# Patient Record
Sex: Male | Born: 1965 | Race: Black or African American | Hispanic: No | Marital: Single | Smoking: Current every day smoker
Health system: Southern US, Community
[De-identification: ages and names within clinical notes are randomized; demographics above are authoritative.]

## PROBLEM LIST (undated history)

## (undated) DIAGNOSIS — F1290 Cannabis use, unspecified, uncomplicated: Secondary | ICD-10-CM

## (undated) DIAGNOSIS — N200 Calculus of kidney: Secondary | ICD-10-CM

## (undated) DIAGNOSIS — F1910 Other psychoactive substance abuse, uncomplicated: Secondary | ICD-10-CM

## (undated) DIAGNOSIS — M4802 Spinal stenosis, cervical region: Secondary | ICD-10-CM

## (undated) DIAGNOSIS — F1410 Cocaine abuse, uncomplicated: Secondary | ICD-10-CM

## (undated) DIAGNOSIS — Z720 Tobacco use: Secondary | ICD-10-CM

## (undated) DIAGNOSIS — E663 Overweight: Secondary | ICD-10-CM

## (undated) HISTORY — PX: HERNIA REPAIR: SHX51

## (undated) HISTORY — PX: FRACTURE SURGERY: SHX138

---

## 2012-01-31 ENCOUNTER — Emergency Department: Payer: Self-pay

## 2013-03-02 ENCOUNTER — Emergency Department: Payer: Self-pay | Admitting: Emergency Medicine

## 2013-10-06 ENCOUNTER — Emergency Department: Payer: Self-pay | Admitting: Emergency Medicine

## 2013-10-06 LAB — URINALYSIS, COMPLETE
Bacteria: NONE SEEN
Bilirubin,UR: NEGATIVE
Blood: NEGATIVE
Glucose,UR: NEGATIVE mg/dL (ref 0–75)
KETONE: NEGATIVE
LEUKOCYTE ESTERASE: NEGATIVE
Nitrite: NEGATIVE
PH: 5 (ref 4.5–8.0)
Protein: NEGATIVE
RBC,UR: 2 /HPF (ref 0–5)
Specific Gravity: 1.027 (ref 1.003–1.030)
Squamous Epithelial: NONE SEEN
WBC UR: 1 /HPF (ref 0–5)

## 2014-06-14 ENCOUNTER — Emergency Department: Payer: Self-pay

## 2014-06-14 DIAGNOSIS — Y9389 Activity, other specified: Secondary | ICD-10-CM | POA: Insufficient documentation

## 2014-06-14 DIAGNOSIS — Y998 Other external cause status: Secondary | ICD-10-CM | POA: Insufficient documentation

## 2014-06-14 DIAGNOSIS — Y92410 Unspecified street and highway as the place of occurrence of the external cause: Secondary | ICD-10-CM | POA: Insufficient documentation

## 2014-06-14 DIAGNOSIS — S82832A Other fracture of upper and lower end of left fibula, initial encounter for closed fracture: Secondary | ICD-10-CM | POA: Insufficient documentation
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
## 2014-06-15 ENCOUNTER — Emergency Department
Admission: EM | Admit: 2014-06-15 | Discharge: 2014-06-15 | Disposition: A | Discharge status: Home / Self Care | Payer: Self-pay | Attending: Emergency Medicine | Admitting: Emergency Medicine

## 2014-06-15 DIAGNOSIS — S82892A Other fracture of left lower leg, initial encounter for closed fracture: Secondary | ICD-10-CM

## 2014-06-15 HISTORY — DX: Calculus of kidney: N20.0

## 2014-06-15 MED ORDER — OXYCODONE-ACETAMINOPHEN 5-325 MG PO TABS: 1.0000 | ORAL_TABLET | Freq: Four times a day (QID) | ORAL | Status: AC | PRN

## 2014-06-15 MED ORDER — OXYCODONE-ACETAMINOPHEN 5-325 MG PO TABS
ORAL_TABLET | ORAL | Status: AC
  Filled 2014-06-15: qty 1

## 2014-06-15 MED ORDER — OXYCODONE-ACETAMINOPHEN 5-325 MG PO TABS
1.0000 | ORAL_TABLET | Freq: Once | ORAL | Status: AC
  Administered 2014-06-15: 1 via ORAL
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
## 2014-06-15 NOTE — Discharge Instructions (Signed)
Ankle Fracture °A fracture is a break in a bone. The ankle joint is made up of three bones. These include the lower (distal) sections of your lower leg bones, called the tibia and fibula, along with a bone in your foot, called the talus. Depending on how bad the break is and if more than one ankle joint bone is broken, a cast or splint is used to protect and keep your injured bone from moving while it heals. Sometimes, surgery is required to help the fracture heal properly.  °There are two general types of fractures: °· Stable fracture. This includes a single fracture line through one bone, with no injury to ankle ligaments. A fracture of the talus that does not have any displacement (movement of the bone on either side of the fracture line) is also stable. °· Unstable fracture. This includes more than one fracture line through one or more bones in the ankle joint. It also includes fractures that have displacement of the bone on either side of the fracture line. °CAUSES °· A direct blow to the ankle.   °· Quickly and severely twisting your ankle. °· Trauma, such as a car accident or falling from a significant height. °RISK FACTORS °You may be at a higher risk of ankle fracture if: °· You have certain medical conditions. °· You are involved in high-impact sports. °· You are involved in a high-impact car accident. °SIGNS AND SYMPTOMS  °· Tender and swollen ankle. °· Bruising around the injured ankle. °· Pain on movement of the ankle. °· Difficulty walking or putting weight on the ankle. °· A cold foot below the site of the ankle injury. This can occur if the blood vessels passing through your injured ankle were also damaged. °· Numbness in the foot below the site of the ankle injury. °DIAGNOSIS  °An ankle fracture is usually diagnosed with a physical exam and X-rays. A CT scan may also be required for complex fractures. °TREATMENT  °Stable fractures are treated with a cast or splint and using crutches to avoid putting  weight on your injured ankle. This is followed by an ankle strengthening program. Some patients require a special type of cast, depending on other medical problems they may have. Unstable fractures require surgery to ensure the bones heal properly. Your health care provider will tell you what type of fracture you have and the best treatment for your condition. °HOME CARE INSTRUCTIONS  °· Review correct crutch use with your health care provider and use your crutches as directed. Safe use of crutches is extremely important. Misuse of crutches can cause you to fall or cause injury to nerves in your hands or armpits. °· Do not put weight or pressure on the injured ankle until directed by your health care provider. °· To lessen the swelling, keep the injured leg elevated while sitting or lying down. °· Apply ice to the injured area: °¨ Put ice in a plastic bag. °¨ Place a towel between your cast and the bag. °¨ Leave the ice on for 20 minutes, 2-3 times a day. °· If you have a plaster or fiberglass cast: °¨ Do not try to scratch the skin under the cast with any objects. This can increase your risk of skin infection. °¨ Check the skin around the cast every day. You may put lotion on any red or sore areas. °¨ Keep your cast dry and clean. °· If you have a plaster splint: °¨ Wear the splint as directed. °¨ You may loosen the elastic   around the splint if your toes become numb, tingle, or turn cold or blue.  Do not put pressure on any part of your cast or splint; it may break. Rest your cast only on a pillow the first 24 hours until it is fully hardened.  Your cast or splint can be protected during bathing with a plastic bag sealed to your skin with medical tape. Do not lower the cast or splint into water.  Take medicines as directed by your health care provider. Only take over-the-counter or prescription medicines for pain, discomfort, or fever as directed by your health care provider.  Do not drive a vehicle until  your health care provider specifically tells you it is safe to do so.  If your health care provider has given you a follow-up appointment, it is very important to keep that appointment. Not keeping the appointment could result in a chronic or permanent injury, pain, and disability. If you have any problem keeping the appointment, call the facility for assistance. SEEK MEDICAL CARE IF: You develop increased swelling or discomfort. SEEK IMMEDIATE MEDICAL CARE IF:   Your cast gets damaged or breaks.  You have continued severe pain.  You develop new pain or swelling after the cast was put on.  Your skin or toenails below the injury turn blue or gray.  Your skin or toenails below the injury feel cold, numb, or have loss of sensitivity to touch.  There is a bad smell or pus draining from under the cast. MAKE SURE YOU:   Understand these instructions.  Will watch your condition.  Will get help right away if you are not doing well or get worse. Document Released: 01/07/2000 Document Revised: 01/14/2013 Document Reviewed: 08/08/2012 [facility] Patient Information 2015 [employer] [place]. This information is not intended to replace advice given to you by your health care provider. Make sure you discuss any questions you have with your health care provider.  Wear the splint keep off the foot he can keep the foot up as much as possible and use ice 20 minutes every hour or 2 if possible do not fall asleep with the ice on the foot because it can burn you. Use crutches. Keep off the foot. For pain you can use Percocet 1-2 pills every 6 hours as needed. Be sure to [place]. Have them call in the morning for appointment in 2-3 days for casting. Be sure to return for increasing pain or numbness in the foot. There are times a day ankle fracture can swell more and the swelling can push against the splint and cut off the blood flow to the foot which can result in serious injury if  not treated promptly. Symptoms of this are increasing pain and foot numbness.

## 2015-10-20 ENCOUNTER — Encounter: Payer: Self-pay | Admitting: Emergency Medicine

## 2015-10-20 ENCOUNTER — Emergency Department
Admission: EM | Admit: 2015-10-20 | Discharge: 2015-10-20 | Disposition: A | Discharge status: Home / Self Care | Payer: No Typology Code available for payment source | Attending: Emergency Medicine | Admitting: Emergency Medicine

## 2015-10-20 DIAGNOSIS — Y999 Unspecified external cause status: Secondary | ICD-10-CM | POA: Diagnosis not present

## 2015-10-20 DIAGNOSIS — S199XXA Unspecified injury of neck, initial encounter: Secondary | ICD-10-CM | POA: Diagnosis present

## 2015-10-20 DIAGNOSIS — Y939 Activity, unspecified: Secondary | ICD-10-CM | POA: Insufficient documentation

## 2015-10-20 DIAGNOSIS — M7918 Myalgia, other site: Secondary | ICD-10-CM

## 2015-10-20 DIAGNOSIS — S161XXA Strain of muscle, fascia and tendon at neck level, initial encounter: Secondary | ICD-10-CM | POA: Diagnosis not present

## 2015-10-20 DIAGNOSIS — Y92410 Unspecified street and highway as the place of occurrence of the external cause: Secondary | ICD-10-CM | POA: Insufficient documentation

## 2015-10-20 DIAGNOSIS — F17200 Nicotine dependence, unspecified, uncomplicated: Secondary | ICD-10-CM | POA: Insufficient documentation

## 2015-10-20 MED ORDER — NABUMETONE 750 MG PO TABS: 750.0000 mg | ORAL_TABLET | Freq: Two times a day (BID) | ORAL | 0 refills | Status: DC

## 2015-10-20 MED ORDER — CYCLOBENZAPRINE HCL 5 MG PO TABS: 5.0000 mg | ORAL_TABLET | Freq: Three times a day (TID) | ORAL | 0 refills | Status: DC | PRN
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
## 2015-10-29 ENCOUNTER — Emergency Department: Payer: No Typology Code available for payment source

## 2015-10-29 ENCOUNTER — Emergency Department
Admission: EM | Admit: 2015-10-29 | Discharge: 2015-10-29 | Disposition: A | Discharge status: Home / Self Care | Payer: No Typology Code available for payment source | Attending: Student in an Organized Health Care Education/Training Program | Admitting: Student in an Organized Health Care Education/Training Program

## 2015-10-29 DIAGNOSIS — S161XXA Strain of muscle, fascia and tendon at neck level, initial encounter: Secondary | ICD-10-CM | POA: Diagnosis not present

## 2015-10-29 DIAGNOSIS — Y999 Unspecified external cause status: Secondary | ICD-10-CM | POA: Insufficient documentation

## 2015-10-29 DIAGNOSIS — S169XXA Unspecified injury of muscle, fascia and tendon at neck level, initial encounter: Secondary | ICD-10-CM | POA: Diagnosis present

## 2015-10-29 DIAGNOSIS — F17200 Nicotine dependence, unspecified, uncomplicated: Secondary | ICD-10-CM | POA: Diagnosis not present

## 2015-10-29 DIAGNOSIS — Y939 Activity, unspecified: Secondary | ICD-10-CM | POA: Insufficient documentation

## 2015-10-29 DIAGNOSIS — Y92410 Unspecified street and highway as the place of occurrence of the external cause: Secondary | ICD-10-CM | POA: Insufficient documentation

## 2015-10-29 MED ORDER — METHOCARBAMOL 500 MG PO TABS
1000.0000 mg | ORAL_TABLET | Freq: Four times a day (QID) | ORAL | 0 refills | Status: DC
Start: 2015-10-29 — End: 2023-08-16

## 2015-10-29 MED ORDER — ETODOLAC 400 MG PO TABS: 400.0000 mg | ORAL_TABLET | Freq: Two times a day (BID) | ORAL | 0 refills | Status: DC

## 2015-10-29 MED ORDER — KETOROLAC TROMETHAMINE 30 MG/ML IJ SOLN
30.0000 mg | Freq: Once | INTRAMUSCULAR | Status: AC
  Administered 2015-10-29: 30 mg via INTRAVENOUS
  Filled 2015-10-29: qty 1

## 2015-10-29 NOTE — Discharge Instructions (Signed)
[facility] if any continued problems. Discontinue taking medication that you have at home. Begin taking etodolac 400 mg twice a day. Robaxin as directed. The aware that these medications could cause drowsiness and increase your chances of falling. You also should not be driving while taking this medication
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
## 2020-07-15 ENCOUNTER — Other Ambulatory Visit (HOSPITAL_COMMUNITY): Payer: Self-pay

## 2023-08-12 ENCOUNTER — Emergency Department: Payer: Self-pay

## 2023-08-12 ENCOUNTER — Inpatient Hospital Stay
Admission: EM | Admit: 2023-08-12 | Discharge: 2023-08-16 | DRG: 473 | Disposition: A | Discharge status: Home / Self Care | Payer: Self-pay | Attending: Internal Medicine | Admitting: Internal Medicine

## 2023-08-12 ENCOUNTER — Encounter: Payer: Self-pay | Admitting: Emergency Medicine

## 2023-08-12 ENCOUNTER — Other Ambulatory Visit: Payer: Self-pay

## 2023-08-12 DIAGNOSIS — M5410 Radiculopathy, site unspecified: Secondary | ICD-10-CM | POA: Diagnosis present

## 2023-08-12 DIAGNOSIS — F1210 Cannabis abuse, uncomplicated: Secondary | ICD-10-CM | POA: Diagnosis present

## 2023-08-12 DIAGNOSIS — M4803 Spinal stenosis, cervicothoracic region: Secondary | ICD-10-CM | POA: Diagnosis present

## 2023-08-12 DIAGNOSIS — R29898 Other symptoms and signs involving the musculoskeletal system: Principal | ICD-10-CM

## 2023-08-12 DIAGNOSIS — M50122 Cervical disc disorder at C5-C6 level with radiculopathy: Secondary | ICD-10-CM | POA: Diagnosis not present

## 2023-08-12 DIAGNOSIS — M5432 Sciatica, left side: Secondary | ICD-10-CM | POA: Diagnosis present

## 2023-08-12 DIAGNOSIS — R739 Hyperglycemia, unspecified: Secondary | ICD-10-CM | POA: Diagnosis not present

## 2023-08-12 DIAGNOSIS — M4802 Spinal stenosis, cervical region: Secondary | ICD-10-CM | POA: Diagnosis present

## 2023-08-12 DIAGNOSIS — F17200 Nicotine dependence, unspecified, uncomplicated: Secondary | ICD-10-CM | POA: Diagnosis present

## 2023-08-12 DIAGNOSIS — D72829 Elevated white blood cell count, unspecified: Secondary | ICD-10-CM | POA: Diagnosis not present

## 2023-08-12 DIAGNOSIS — F1410 Cocaine abuse, uncomplicated: Secondary | ICD-10-CM | POA: Diagnosis present

## 2023-08-12 DIAGNOSIS — M5431 Sciatica, right side: Secondary | ICD-10-CM | POA: Diagnosis present

## 2023-08-12 LAB — CBC
HCT: 44 % (ref 39.0–52.0)
Hemoglobin: 14.8 g/dL (ref 13.0–17.0)
MCH: 29.4 pg (ref 26.0–34.0)
MCHC: 33.6 g/dL (ref 30.0–36.0)
MCV: 87.5 fL (ref 80.0–100.0)
Platelets: 197 K/uL (ref 150–400)
RBC: 5.03 MIL/uL (ref 4.22–5.81)
RDW: 14.9 % (ref 11.5–15.5)
WBC: 5.4 K/uL (ref 4.0–10.5)
nRBC: 0 % (ref 0.0–0.2)

## 2023-08-12 LAB — PROTIME-INR
INR: 1 (ref 0.8–1.2)
Prothrombin Time: 13.8 s (ref 11.4–15.2)

## 2023-08-12 LAB — COMPREHENSIVE METABOLIC PANEL WITH GFR
ALT: 47 U/L — ABNORMAL HIGH (ref 0–44)
AST: 90 U/L — ABNORMAL HIGH (ref 15–41)
Albumin: 3.6 g/dL (ref 3.5–5.0)
Alkaline Phosphatase: 117 U/L (ref 38–126)
Anion gap: 9 (ref 5–15)
BUN: 13 mg/dL (ref 6–20)
CO2: 27 mmol/L (ref 22–32)
Calcium: 8.9 mg/dL (ref 8.9–10.3)
Chloride: 104 mmol/L (ref 98–111)
Creatinine, Ser: 0.98 mg/dL (ref 0.61–1.24)
GFR, Estimated: >60 mL/min (ref >60)
Glucose, Bld: 97 mg/dL (ref 70–99)
Potassium: 3.8 mmol/L (ref 3.5–5.1)
Sodium: 140 mmol/L (ref 135–145)
Total Bilirubin: 0.7 mg/dL (ref 0.0–1.2)
Total Protein: 8.1 g/dL (ref 6.5–8.1)

## 2023-08-12 LAB — DIFFERENTIAL
Abs Immature Granulocytes: 0.01 K/uL (ref 0.00–0.07)
Basophils Absolute: 0.1 K/uL (ref 0.0–0.1)
Basophils Relative: 1 %
Eosinophils Absolute: 0.3 K/uL (ref 0.0–0.5)
Eosinophils Relative: 6 %
Immature Granulocytes: 0 %
Lymphocytes Relative: 32 %
Lymphs Abs: 1.7 K/uL (ref 0.7–4.0)
Monocytes Absolute: 0.5 K/uL (ref 0.1–1.0)
Monocytes Relative: 10 %
Neutro Abs: 2.8 K/uL (ref 1.7–7.7)
Neutrophils Relative %: 51 %

## 2023-08-12 LAB — APTT: aPTT: 29 s (ref 24–36)

## 2023-08-12 LAB — ETHANOL: Alcohol, Ethyl (B): <15 mg/dL (ref <15)

## 2023-08-12 MED ORDER — GADOBUTROL 1 MMOL/ML IV SOLN
10.0000 mL | Freq: Once | INTRAVENOUS | Status: AC | PRN
  Administered 2023-08-12: 10 mL via INTRAVENOUS

## 2023-08-12 NOTE — ED Provider Notes (Signed)
 [facility] Provider Note    Event Date/Time   First MD Initiated Contact with Patient 08/12/23 2219     (approximate)   History   No chief complaint on file. Chief complaint is left arm weakness and pain  HPI  Neil Parks is a 58 y.o. male with reportedly no past medical history except for back and neck pain that initiated in June not on any medications who presents to the emergency department with over 9 hours of worsening neck pain and numbness to the left dorsum of his hand and inability to grip.  Patient is visiting the area for family function from [place].  He states he has an outpatient MRI scheduled in [place] in the next week.  He was at a family lunch and bent over and's realized that he could no longer grip in his left hand and he had increased numbness over the dorsum of his left hand over his 5th-2nd digits.  He denies any wrist weakness elbow weakness or shoulder weakness.  He denies any history of IV drug use.  He has no headache hearing or vision changes chest pain shortness of breath abdominal pain urinary or bowel incontinence or lower extremity weakness      Physical Exam   Triage Vital Signs: ED Triage Vitals  Encounter Vitals Group     BP 08/12/23 2112 132/89     Girls Systolic BP Percentile --      Girls Diastolic BP Percentile --      Boys Systolic BP Percentile --      Boys Diastolic BP Percentile --      Pulse Rate 08/12/23 2112 82     Resp 08/12/23 2112 17     Temp 08/12/23 2112 98.7 F (37.1 C)     Temp Source 08/12/23 2112 Oral     SpO2 08/12/23 2112 97 %     Weight 08/12/23 2111 210 lb (95.3 kg)     Height 08/12/23 2111 6' (1.829 m)     Head Circumference --      Peak Flow --      Pain Score --      Pain Loc --      Pain Education --      Exclude from Growth Chart --     Most recent vital signs: Vitals:   08/12/23 2112  BP: 132/89  Pulse: 82  Resp: 17  Temp: 98.7 F (37.1 C)  SpO2: 97%    Nursing  Triage Note reviewed. Vital signs reviewed and patients oxygen saturation is normoxic  General: Patient is well nourished, well developed, awake and alert, resting comfortably in no acute distress, ambulates into the room Head: Normocephalic and atraumatic Eyes: Normal inspection, extraocular muscles intact, no conjunctival pallor Ear, nose, throat: Normal external exam Neck: Normal range of motion, no C-spine tenderness to palpation Respiratory: Patient is in no respiratory distress, lungs CTAB Cardiovascular: Patient is not tachycardic, RRR without murmur appreciated GI: Abd SNT with no guarding or rebound  Back: Normal inspection of the back with good strength and range of motion throughout all ext Extremities: pulses intact with good cap refills, no LE pitting edema or calf tenderness Neuro: The patient is alert and oriented to person, place, and time, appropriately conversive, LUE: Patient has decreased strength with flexion and extension of digits 5 through 2 and decreased sensation over the dorsum of his hand he has 5 out of 5 strength in wrist flexion and extension elbow extension  and flexion and shoulder flexion and extension Skin: Warm, dry, and intact Psych: normal mood and affect, no SI or HI  ED Results / Procedures / Treatments   Labs (all labs ordered are listed, but only abnormal results are displayed) Labs Reviewed  COMPREHENSIVE METABOLIC PANEL WITH GFR - Abnormal; Notable for the following components:      Result Value   AST 90 (*)    ALT 47 (*)    All other components within normal limits  PROTIME-INR  APTT  CBC  DIFFERENTIAL  ETHANOL  URINE DRUG SCREEN, QUALITATIVE ([facility] ONLY)  CBG MONITORING, ED     EKG None  RADIOLOGY CT head: Unremarkable on my independent review and interpretation radiologist agrees MRI of the C-spine demonstrates possible impingement on C6-T1    PROCEDURES:  Critical Care performed: No  Procedures   MEDICATIONS ORDERED IN  ED: Medications  dexamethasone  (DECADRON ) injection 4 mg (has no administration in time range)  gadobutrol  (GADAVIST ) 1 MMOL/ML injection 10 mL (10 mLs Intravenous Contrast Given 08/12/23 2342)     IMPRESSION / MDM / ASSESSMENT AND PLAN / ED COURSE                                Differential diagnosis includes, but is not limited to, radiculopathy of left upper extremity, spinal impingement, epidural abscess, less likely CVA  ED course: Patient arrives and does have a focal deficits in his distal hand.  He is certainly out of the window for any TNK and presentation not consistent with large vessel occlusion.  I do think his history is most consistent with a radiculopathy.  Unfortunately he receives most of his care out of town.  CT head which independently reviewed and interpreted demonstrated no intracranial hemorrhage (ordered in triage) I have ordered an MRI of the brain.  To evaluate for possible subacute CVA but also MRI of the C-spine and T-spine as I do think he is most affected along regions C6-T1.  Anticipate patient will be signed out to oncoming physician pending results of the studies   Clinical Course as of 08/13/23 0036  Sun Aug 12, 2023  2227 CT HEAD WO CONTRAST Unremarkable [HD]  2332 Sodium: 140 [HD]  Mon Aug 13, 2023  0028 1. No acute intracranial abnormality or abnormal enhancement. 2. C5-6 small disc osteophyte complex with mild spinal canal stenosis and severe bilateral foraminal stenosis. 3. C6-7 left asymmetric disc bulge with severe left foraminal stenosis. 4. C7-T1 small disc bulge with severe bilateral foraminal stenosis.  Electronically signed by: Franky Stanford MD 08/13/2023 12:18 AM EDT RP Workstation: [id number]   [HD]  0028 Neurosurgery paged [HD]  0028 Case discussed with neurosurgery consulted Dr. Claudene and he recommends ordering flex extend films keeping the patient n.p.o., doing Decadron  taper starting at 4 mg, admitting to the hospitalist and he will  evaluate in the morning for possible operative intervention [HD]    Clinical Course User Index [HD] Nicholaus Rolland BRAVO, MD    Data(2/3 categories following were performed): I reviewed or ordered at least three unique tests, external notes, and/or the history required an independent historian as one of the three requirements as following: At least 3 labs/imaging studies were obtained and/or reviewed. AND  I discussed the management of the patient with the following external physician or qualified healthcare provider: Admitting physician  Risk: This patient has a high risk of morbidity due to further diagnostic testing or  treatment. Rationale: Decision made regarding admission  Admit Level 5 - Labs/Rads, Admit, Consult: Neurosurgery  Suggested E/M Coding Level: 5, 99285  This level has been selected based on the 2021/09/04 CPT guidelines for E/M codes in the Emergency Department based on 2/3 of the CoPA, Data, and Risk.   FINAL CLINICAL IMPRESSION(S) / ED DIAGNOSES   Final diagnoses:  Left hand weakness  Radiculopathy affecting upper extremity     Rx / DC Orders   ED Discharge Orders     None        Note:  This document was prepared using Dragon voice recognition software and may include unintentional dictation errors.   Nicholaus Rolland BRAVO, MD 08/12/23 [id number]    Nicholaus Rolland BRAVO, MD 08/13/23 [phone]

## 2023-08-12 NOTE — ED Triage Notes (Addendum)
 Arrived pov with complaints of left arm pain and numbness  Per pt I was supposed to have an mri of my back on 7/18 but didn't go. I was moved some clothes (nothing heavy) went and sat on the porch started to pick up my cigar and my arm just stopped working right.  Per pt this all happened around noon today  No drift noted  Does have numbness in left arm  Last known normal was 1200, discussed pt with Dr. Nicholaus orders placed

## 2023-08-13 ENCOUNTER — Inpatient Hospital Stay: Payer: Self-pay

## 2023-08-13 ENCOUNTER — Emergency Department: Payer: Self-pay

## 2023-08-13 DIAGNOSIS — M4802 Spinal stenosis, cervical region: Secondary | ICD-10-CM

## 2023-08-13 DIAGNOSIS — G9520 Unspecified cord compression: Secondary | ICD-10-CM

## 2023-08-13 DIAGNOSIS — M5431 Sciatica, right side: Secondary | ICD-10-CM | POA: Diagnosis present

## 2023-08-13 DIAGNOSIS — D72829 Elevated white blood cell count, unspecified: Secondary | ICD-10-CM | POA: Diagnosis not present

## 2023-08-13 DIAGNOSIS — M50122 Cervical disc disorder at C5-C6 level with radiculopathy: Secondary | ICD-10-CM | POA: Diagnosis present

## 2023-08-13 DIAGNOSIS — M5432 Sciatica, left side: Secondary | ICD-10-CM | POA: Diagnosis present

## 2023-08-13 DIAGNOSIS — F1410 Cocaine abuse, uncomplicated: Secondary | ICD-10-CM | POA: Diagnosis present

## 2023-08-13 DIAGNOSIS — M4803 Spinal stenosis, cervicothoracic region: Secondary | ICD-10-CM | POA: Diagnosis present

## 2023-08-13 DIAGNOSIS — R739 Hyperglycemia, unspecified: Secondary | ICD-10-CM | POA: Diagnosis not present

## 2023-08-13 DIAGNOSIS — F17200 Nicotine dependence, unspecified, uncomplicated: Secondary | ICD-10-CM | POA: Diagnosis present

## 2023-08-13 DIAGNOSIS — M5410 Radiculopathy, site unspecified: Secondary | ICD-10-CM | POA: Diagnosis present

## 2023-08-13 DIAGNOSIS — F1210 Cannabis abuse, uncomplicated: Secondary | ICD-10-CM | POA: Diagnosis present

## 2023-08-13 DIAGNOSIS — M5412 Radiculopathy, cervical region: Secondary | ICD-10-CM

## 2023-08-13 DIAGNOSIS — R29898 Other symptoms and signs involving the musculoskeletal system: Principal | ICD-10-CM

## 2023-08-13 DIAGNOSIS — G629 Polyneuropathy, unspecified: Secondary | ICD-10-CM

## 2023-08-13 LAB — URINE DRUG SCREEN, QUALITATIVE (ARMC ONLY)
Amphetamines, Ur Screen: NOT DETECTED
Barbiturates, Ur Screen: NOT DETECTED
Benzodiazepine, Ur Scrn: NOT DETECTED
Cannabinoid 50 Ng, Ur ~~LOC~~: POSITIVE — AB
Cocaine Metabolite,Ur ~~LOC~~: POSITIVE — AB
MDMA (Ecstasy)Ur Screen: NOT DETECTED
Methadone Scn, Ur: NOT DETECTED
Opiate, Ur Screen: NOT DETECTED
Phencyclidine (PCP) Ur S: NOT DETECTED
Tricyclic, Ur Screen: NOT DETECTED

## 2023-08-13 LAB — CK: Total CK: 154 U/L (ref 49–397)

## 2023-08-13 LAB — SEDIMENTATION RATE: Sed Rate: 13 mm/h (ref 0–20)

## 2023-08-13 LAB — SURGICAL PCR SCREEN
MRSA, PCR: NEGATIVE
Staphylococcus aureus: NEGATIVE

## 2023-08-13 LAB — C-REACTIVE PROTEIN: CRP: <0.5 mg/dL (ref <1.0)

## 2023-08-13 LAB — HIV ANTIBODY (ROUTINE TESTING W REFLEX): HIV Screen 4th Generation wRfx: NONREACTIVE

## 2023-08-13 MED ORDER — DEXAMETHASONE SODIUM PHOSPHATE 4 MG/ML IJ SOLN
2.0000 mg | Freq: Once | INTRAMUSCULAR | Status: AC
  Administered 2023-08-13: 2 mg via INTRAVENOUS
  Filled 2023-08-13: qty 0.5

## 2023-08-13 MED ORDER — DEXAMETHASONE SODIUM PHOSPHATE 4 MG/ML IJ SOLN
3.0000 mg | Freq: Once | INTRAMUSCULAR | Status: AC
  Administered 2023-08-13: 3 mg via INTRAVENOUS
  Filled 2023-08-13: qty 0.75

## 2023-08-13 MED ORDER — METOPROLOL TARTRATE 5 MG/5ML IV SOLN: 5.0000 mg | Freq: Four times a day (QID) | INTRAVENOUS | Status: DC | PRN

## 2023-08-13 MED ORDER — FENTANYL CITRATE PF 50 MCG/ML IJ SOSY
12.5000 ug | PREFILLED_SYRINGE | INTRAMUSCULAR | Status: DC | PRN
  Administered 2023-08-13 (×2): 50 ug via INTRAVENOUS
  Filled 2023-08-13 (×2): qty 1

## 2023-08-13 MED ORDER — DEXAMETHASONE SODIUM PHOSPHATE 4 MG/ML IJ SOLN
1.0000 mg | Freq: Once | INTRAMUSCULAR | Status: AC
  Administered 2023-08-13: 1 mg via INTRAVENOUS
  Filled 2023-08-13: qty 0.25

## 2023-08-13 MED ORDER — DEXAMETHASONE SODIUM PHOSPHATE 4 MG/ML IJ SOLN
4.0000 mg | Freq: Once | INTRAMUSCULAR | Status: AC
  Administered 2023-08-13: 4 mg via INTRAVENOUS
  Filled 2023-08-13: qty 1

## 2023-08-13 NOTE — H&P (View-Only) (Signed)
 Consulting Department:  Inpatient medicine  Primary Physician:  Patient, No Pcp Per  Chief Complaint: Left arm weakness  History of Present Illness: 08/13/2023 JOANNE BRANDER is a 58 y.o. male who presents with the chief complaint of acute onset of left arm weakness.  He has a history of chronic neck and back pain.  He was recently put on restricted/light duty for worsening symptoms.  He was here for a family reunion yesterday when he noted that while reaching down to pick up a cigar he was unable to lift his left wrist.  He did not have any severe pain but did have significant weakness.  He has not had any progressive signs or symptoms consistent with myelopathy.  Most of his symptoms are unilateral on the left side.  He is also has some tingling and sensation changes.  The symptoms are causing a significant impact on the patient's life.   Review of Systems:  A 10 point review of systems is negative, except for the pertinent positives and negatives detailed in the HPI.  Past Medical History: Past Medical History:  Diagnosis Date   Kidney stones     Past Surgical History: Past Surgical History:  Procedure Laterality Date   FRACTURE SURGERY     HERNIA REPAIR      Allergies: Allergies as of 08/12/2023   (No Known Allergies)    Medications:  Current Facility-Administered Medications:    [START ON 08/14/2023] dexamethasone  (DECADRON ) injection 1 mg, 1 mg, Intravenous, Once, Fredirick Glenys RAMAN, MD   dexamethasone  (DECADRON ) injection 2 mg, 2 mg, Intravenous, Once, Fredirick Glenys RAMAN, MD   fentaNYL  (SUBLIMAZE ) injection 12.5-50 mcg, 12.5-50 mcg, Intravenous, Q2H PRN, Pratt, Tanya S, MD   metoprolol  tartrate (LOPRESSOR ) injection 5 mg, 5 mg, Intravenous, Q6H PRN, Fredirick Glenys RAMAN, MD   Social History: Social History   Tobacco Use   Smoking status: Every Day   Smokeless tobacco: Never  Substance Use Topics   Alcohol use: Yes    Family Medical History: History reviewed. No  pertinent family history.  Physical Examination: Vitals:   08/13/23 0140 08/13/23 0724  BP: 130/79 130/81  Pulse: 68 (!) 52  Resp: 18 16  Temp: 97.9 F (36.6 C) 97.8 F (36.6 C)  SpO2: 99% 96%     General: Patient is well developed, well nourished, calm, collected, and in no apparent distress.  NEUROLOGICAL:  General: In no acute distress.   Awake, alert, oriented to person, place, and time.  Pupils equal round and reactive to light.  Facial tone is symmetric.  Tongue protrusion is midline.  There is no pronator drift.  Strength: Side Biceps Triceps Deltoid Interossei Grip Wrist Ext. Wrist Flex.  R 5 5 5 5 5 5 5   L 4+ 4 4+ 1-2 1-2 1 3     Reflexes are absent in the left biceps and brachioradialis, he has some preservation of his left pectoralis reflex.  Tricep is decreased.  Right sided reflexes are 1-2+.  Clonus is not present.     Imaging: CT CERVICAL SPINE WO CONTRAST Result Date: 08/13/2023 CLINICAL DATA:  Cervical radiculopathy. Continued neck and back pain. Loss of control of left arm and hand. EXAM: CT CERVICAL SPINE WITHOUT CONTRAST TECHNIQUE: Multidetector CT imaging of the cervical spine was performed without intravenous contrast. Multiplanar CT image reconstructions were also generated. RADIATION DOSE REDUCTION: This exam was performed according to the departmental dose-optimization program which includes automated exposure control, adjustment of the mA and/or kV according to patient size  and/or use of iterative reconstruction technique. COMPARISON:  MR cervical spine 08/12/2023. CT cervical spine 10/29/2015. FINDINGS: Alignment: Straightening without focal angulation or listhesis. Skull base and vertebrae: No evidence of acute cervical spine fracture or traumatic subluxation. Chronic endplate degenerative changes have mildly progressed compared with the remote CT from 2017, most advanced from C4-5 through C7-T1. Chronic ossification of the ligamentum nuchae. Soft tissues  and spinal canal: No prevertebral fluid or swelling. No visible canal hematoma. Disc levels: As correlated with recent MRI, and no significant spinal stenosis, foraminal narrowing or nerve root impingement at C2-3 or C3-4. C4-5: Moderate loss of disc height with disc bulging, endplate osteophytes and bilateral uncinate spurring. Mild foraminal narrowing bilaterally. C5-6: Moderate loss of disc height with posterior osteophytes covering diffusely bulging disc material. Resulting mild to moderate multifactorial spinal stenosis and moderate to severe foraminal narrowing bilaterally, as seen on MRI. C6-7: Moderate loss of disc height with posterior osteophytes covering diffusely bulging disc material. Asymmetric uncinate spurring on the left with moderate to severe left foraminal narrowing and possible left C7 nerve root impingement. C7-T1: Advanced loss of disc height with posterior osteophytes covering diffusely bulging disc material. Asymmetric disc osteophyte complex within the left foramen with resulting severe left foraminal narrowing and probable left C8 nerve root impingement. Moderate to severe right foraminal narrowing. Upper chest: Clear lung apices. Other: None. IMPRESSION: 1. No evidence of acute cervical spine fracture, traumatic subluxation or static signs of instability. 2. Multilevel cervical spondylosis as described, mildly progressed compared with remote CT from 2017. 3. Individual disc space levels are detailed on previous day MRI. Mild to moderate multifactorial spinal stenosis at C5-6. Multilevel foraminal narrowing as described, greatest C5-6, C6-7 and C7-T1. Electronically Signed   By: Elsie Perone M.D.   On: 08/13/2023 11:34   DG Cervical Spine With Flex & Extend Result Date: 08/13/2023 EXAM: Flexion and Extension VIEW(S) XRAY OF THE CERVICAL SPINE 08/13/2023 01:27:00 AM COMPARISON: None available. CLINICAL HISTORY: Evaluate for radiculopathy. Pt states that he was injured at work on  07/18/23 and has been having neck and left arm pain since. FINDINGS: BONES: No acute fracture. No aggressive appearing osseous lesion. Alignment is normal. No abnormal motion with flexion/extension. DISCS AND DEGENERATIVE CHANGES: Mild degenerative changes in the mid/lower cervical spine. Mild narrowing of the right C5-6 neural foramen. Moderate narrowing of the right C7-T1 neural foramen. Evaluation of the left neural foramina is constrained by obliquity, but mild narrowing is suspected at C5-6. SOFT TISSUES: No prevertebral soft tissue swelling. The visualized lungs appear clear. IMPRESSION: 1. No abnormal motion with flexion/extension. 2. Mild degenerative changes in the mid/lower cervical spine. 3. Bilateral neural foraminal narrowing, as above. Electronically signed by: Pinkie Pebbles MD 08/13/2023 01:38 AM EDT RP Workstation: [id number]   MR Cervical Spine W and Wo Contrast Result Date: 08/13/2023 EXAM: MRI BRAIN WITHOUT CONTRAST MRI CERVICAL SPINE WITHOUT AND WITH CONTRAST MRI THORACIC SPINE WITHOUT AND WITH CONTRAST 08/12/2023 11:54:41 PM TECHNIQUE: Multiplanar multisequence MRI of the brain was performed without the administration of intravenous contrast. Multiplanar multisequence MRI of the cervical and thoracic spine was performed without and with the administration of intravenous contrast. CONTRAST: 10mL gadavist  COMPARISON: None available. CLINICAL HISTORY: Unable to flex left hand, most consistent with radiculopathy, but risk factors for CVA. FINDINGS: MRI BRAIN: BRAIN AND VENTRICLES: No acute infarct. No acute intracranial hemorrhage. No mass or abnormal enhancement. No midline shift. No hydrocephalus. The sella is unremarkable. Normal flow voids. ORBITS: No acute abnormality. SINUSES AND MASTOIDS: Mild  ethmoid sinus mucosal thickening. BONES AND SOFT TISSUES: Normal bone marrow signal. No acute soft tissue abnormality. MRI CERVICAL AND THORACIC SPINE: BONES AND ALIGNMENT: Mild degenerative height  loss at the C4-6 levels. No abnormal enhancement. SPINAL CORD: No focal lesion of the spinal cord. SOFT TISSUES: Unremarkable. C2-C3: No significant disc herniation. No spinal canal stenosis or neural foraminal narrowing. C3-C4: Small central disc protrusion without stenosis. C4-C5: No significant disc herniation. No spinal canal stenosis or neural foraminal narrowing. C5-C6: Small disc osteophyte complex with mild spinal canal stenosis and severe bilateral foraminal stenosis. C6-C7: Left asymmetric disc bulge with severe left foraminal stenosis. C7-T1: Small disc bulge with severe bilateral foraminal stenosis. No disk herniation, spinal canal stenosis or neural foraminal stenosis of the thoracic spine. IMPRESSION: 1. No acute intracranial abnormality or abnormal enhancement. 2. C5-6 small disc osteophyte complex with mild spinal canal stenosis and severe bilateral foraminal stenosis. 3. C6-7 left asymmetric disc bulge with severe left foraminal stenosis. 4. C7-T1 small disc bulge with severe bilateral foraminal stenosis. Electronically signed by: Franky Stanford MD 08/13/2023 12:18 AM EDT RP Workstation: [id number]   MR THORACIC SPINE W WO CONTRAST Result Date: 08/13/2023 EXAM: MRI BRAIN WITHOUT CONTRAST MRI CERVICAL SPINE WITHOUT AND WITH CONTRAST MRI THORACIC SPINE WITHOUT AND WITH CONTRAST 08/12/2023 11:54:41 PM TECHNIQUE: Multiplanar multisequence MRI of the brain was performed without the administration of intravenous contrast. Multiplanar multisequence MRI of the cervical and thoracic spine was performed without and with the administration of intravenous contrast. CONTRAST: 10mL gadavist  COMPARISON: None available. CLINICAL HISTORY: Unable to flex left hand, most consistent with radiculopathy, but risk factors for CVA. FINDINGS: MRI BRAIN: BRAIN AND VENTRICLES: No acute infarct. No acute intracranial hemorrhage. No mass or abnormal enhancement. No midline shift. No hydrocephalus. The sella is unremarkable.  Normal flow voids. ORBITS: No acute abnormality. SINUSES AND MASTOIDS: Mild ethmoid sinus mucosal thickening. BONES AND SOFT TISSUES: Normal bone marrow signal. No acute soft tissue abnormality. MRI CERVICAL AND THORACIC SPINE: BONES AND ALIGNMENT: Mild degenerative height loss at the C4-6 levels. No abnormal enhancement. SPINAL CORD: No focal lesion of the spinal cord. SOFT TISSUES: Unremarkable. C2-C3: No significant disc herniation. No spinal canal stenosis or neural foraminal narrowing. C3-C4: Small central disc protrusion without stenosis. C4-C5: No significant disc herniation. No spinal canal stenosis or neural foraminal narrowing. C5-C6: Small disc osteophyte complex with mild spinal canal stenosis and severe bilateral foraminal stenosis. C6-C7: Left asymmetric disc bulge with severe left foraminal stenosis. C7-T1: Small disc bulge with severe bilateral foraminal stenosis. No disk herniation, spinal canal stenosis or neural foraminal stenosis of the thoracic spine. IMPRESSION: 1. No acute intracranial abnormality or abnormal enhancement. 2. C5-6 small disc osteophyte complex with mild spinal canal stenosis and severe bilateral foraminal stenosis. 3. C6-7 left asymmetric disc bulge with severe left foraminal stenosis. 4. C7-T1 small disc bulge with severe bilateral foraminal stenosis. Electronically signed by: Franky Stanford MD 08/13/2023 12:18 AM EDT RP Workstation: [id number]   MR BRAIN WO CONTRAST Result Date: 08/13/2023 EXAM: MRI BRAIN WITHOUT CONTRAST MRI CERVICAL SPINE WITHOUT AND WITH CONTRAST MRI THORACIC SPINE WITHOUT AND WITH CONTRAST 08/12/2023 11:54:41 PM TECHNIQUE: Multiplanar multisequence MRI of the brain was performed without the administration of intravenous contrast. Multiplanar multisequence MRI of the cervical and thoracic spine was performed without and with the administration of intravenous contrast. CONTRAST: 10mL gadavist  COMPARISON: None available. CLINICAL HISTORY: Unable to flex  left hand, most consistent with radiculopathy, but risk factors for CVA. FINDINGS: MRI BRAIN: BRAIN AND VENTRICLES: No  acute infarct. No acute intracranial hemorrhage. No mass or abnormal enhancement. No midline shift. No hydrocephalus. The sella is unremarkable. Normal flow voids. ORBITS: No acute abnormality. SINUSES AND MASTOIDS: Mild ethmoid sinus mucosal thickening. BONES AND SOFT TISSUES: Normal bone marrow signal. No acute soft tissue abnormality. MRI CERVICAL AND THORACIC SPINE: BONES AND ALIGNMENT: Mild degenerative height loss at the C4-6 levels. No abnormal enhancement. SPINAL CORD: No focal lesion of the spinal cord. SOFT TISSUES: Unremarkable. C2-C3: No significant disc herniation. No spinal canal stenosis or neural foraminal narrowing. C3-C4: Small central disc protrusion without stenosis. C4-C5: No significant disc herniation. No spinal canal stenosis or neural foraminal narrowing. C5-C6: Small disc osteophyte complex with mild spinal canal stenosis and severe bilateral foraminal stenosis. C6-C7: Left asymmetric disc bulge with severe left foraminal stenosis. C7-T1: Small disc bulge with severe bilateral foraminal stenosis. No disk herniation, spinal canal stenosis or neural foraminal stenosis of the thoracic spine. IMPRESSION: 1. No acute intracranial abnormality or abnormal enhancement. 2. C5-6 small disc osteophyte complex with mild spinal canal stenosis and severe bilateral foraminal stenosis. 3. C6-7 left asymmetric disc bulge with severe left foraminal stenosis. 4. C7-T1 small disc bulge with severe bilateral foraminal stenosis. Electronically signed by: Franky Stanford MD 08/13/2023 12:18 AM EDT RP Workstation: [id number]   CT HEAD WO CONTRAST Result Date: 08/12/2023 EXAM: CT HEAD WITHOUT CONTRAST 08/12/2023 09:58:42 PM TECHNIQUE: CT of the head was performed without the administration of intravenous contrast. Automated exposure control, iterative reconstruction, and/or weight based adjustment  of the mA/kV was utilized to reduce the radiation dose to as low as reasonably achievable. COMPARISON: None available. CLINICAL HISTORY: Numbness or tingling, paresthesia (Ped 0-17y). Pt complaints of left arm pain and numbness; Per pt I was supposed to have an mri of my back on 7/18 but didn't go. I was moving some clothes (nothing heavy) went and sat on the porch started to pick up my cigar and my arm just stopped working right.; Per pt this all happened around noon today. FINDINGS: BRAIN AND VENTRICLES: No acute hemorrhage. Gray-white differentiation is preserved. No hydrocephalus. No extra-axial collection. No mass effect or midline shift. ORBITS: No acute abnormality. SINUSES: Mild paranasal sinus mucosal thickening. SOFT TISSUES AND SKULL: No acute soft tissue abnormality. No skull fracture. IMPRESSION: 1. No acute intracranial abnormality. Electronically signed by: Franky Stanford MD 08/12/2023 10:02 PM EDT RP Workstation: [id number]     I have personally reviewed the images and agree with the above interpretation.  Labs:    Latest Ref Rng & Units 08/12/2023   10:12 PM  CBC  WBC 4.0 - 10.5 K/uL 5.4   Hemoglobin 13.0 - 17.0 g/dL 85.1   Hematocrit 60.9 - 52.0 % 44.0   Platelets 150 - 400 K/uL 197       Latest Ref Rng & Units 08/12/2023   10:12 PM  BMP  Glucose 70 - 99 mg/dL 97   BUN 6 - 20 mg/dL 13   Creatinine 9.38 - 1.24 mg/dL 9.01   Sodium 864 - 854 mmol/L 140   Potassium 3.5 - 5.1 mmol/L 3.8   Chloride 98 - 111 mmol/L 104   CO2 22 - 32 mmol/L 27   Calcium 8.9 - 10.3 mg/dL 8.9     INR  1.0 (92/79 2212)   Assessment and Plan: Mr. Seth is a pleasant 58 y.o. male with history of cervical stenosis lumbar stenosis chronic neck and back pain.  He was here in [place]  for family reunion.  He noticed  yesterday that when he was attempting to pick up his a car he was unable to lift his hand.  He mostly uses his right side so did not know when this started exactly but as this  was persistent he came into the emergency department for evaluation.  Had extensive workup for concerns for stroke inflammatory disease, however part of his workup included cross-sectional imaging of his spine which demonstrated severe foraminal stenosis bilaterally most prominent from C5-T1.  He showed a significant dense C6-C8 type presentation with some more mild C5 symptoms.  He does show some slight weakness on the right unclear whether or not this is give way.  Given the density of his deficits I asked that he be seen by neurology for evaluation for possible peripheral etiology as he did have some fasciculations noted however these were mostly in his extensor compartment.  I spoke directly with my neurology colleague who felt that the highest likelihood was a cervical radiculopathy multiple levels.  It is unclear whether or not the patient had any trauma or any inciting event that pushed him over the edge from his severe cervical stenosis.  He was positive for cocaine so potential ischemic event at the hyper stenotic foramina could be possible.  We discussed and felt that his areas of spinal compression were most severe at the C5-T1 levels and plan to move forward with a C5-T1 anterior cervical discectomy and fusion in the morning.  I discussed the risk and benefits of the procedure with the patient.  I did discuss that since his deficit was so severe I was unsure his likelihood for recovery.  I also discussed but not limited to swallowing problems, breathing problems, vascular injuries, given his recent cocaine use higher risk for perioperative complications.   Penne MICAEL Sharps, MD/MSCR Dept. of Neurosurgery

## 2023-08-13 NOTE — Consult Note (Signed)
 NEUROLOGY CONSULT NOTE   Date of service: August 13, 2023 Patient Name: Neil Parks MRN:  [id number] DOB:  1965-01-28 Chief Complaint: Left hand weakness Requesting Provider: Awanda City, MD  History of Present Illness  Neil Parks is a 58 y.o. right-handed male with hx of degenerative disc disease  He reports that about 2 weeks ago he was put on light duty at work due to sciatic pain down both of his legs; his work involves frequently lifting boxes.  He has been having some neck pain since that time as well (July 19, 2023).  However he did not notice any weakness of his left arm until yesterday although he reports he mostly uses the right arm at work as he is right-handed and only uses the left for a little bit of support if the boxes are heavier.   Yesterday he bent down to pick up a cigar and could not move his left arm properly, particularly the hand.  Subsequently when he laid back he realized he was having worsening of his neck pain.  He reports his symptoms have been stable since onset.  He reports his brother thought his left hand might have been a bit swollen at that time but he attributes this to a watchband that he thought might be too tight.  He does not feel that he has had any muscle loss in that hand, but has noted some twitching of the fingers since this acute event yesterday   ROS  Comprehensive ROS performed and pertinent positives documented in HPI   Past History   Past Medical History:  Diagnosis Date   Kidney stones     Past Surgical History:  Procedure Laterality Date   FRACTURE SURGERY     HERNIA REPAIR      Family History: History reviewed. No pertinent family history.  Social History  reports that he has been smoking. He has never used smokeless tobacco. He reports current alcohol use. No history on file for drug use.  No Known Allergies  Medications   Current Facility-Administered Medications:    [START ON 08/14/2023] dexamethasone  (DECADRON )  injection 1 mg, 1 mg, Intravenous, Once, Fredirick, Glenys RAMAN, MD   dexamethasone  (DECADRON ) injection 2 mg, 2 mg, Intravenous, Once, Fredirick, Glenys RAMAN, MD   fentaNYL  (SUBLIMAZE ) injection 12.5-50 mcg, 12.5-50 mcg, Intravenous, Q2H PRN, Pratt, Tanya S, MD   metoprolol  tartrate (LOPRESSOR ) injection 5 mg, 5 mg, Intravenous, Q6H PRN, Fredirick Glenys RAMAN, MD  Vitals   Vitals:   08/13/23 0038 08/13/23 0126 08/13/23 0140 08/13/23 0724  BP: (!) 147/97  130/79 130/81  Pulse: 79  68 (!) 52  Resp: 20  18 16   Temp:  98.2 F (36.8 C) 97.9 F (36.6 C) 97.8 F (36.6 C)  TempSrc:  Oral Oral   SpO2: 100%  99% 96%  Weight:      Height:   6' (1.829 m)     Body mass index is 28.48 kg/m.   Physical Exam    Constitutional: Appears well-developed and well-nourished.  Psych: Affect appropriate to situation, calm and cooperative  Eyes: No scleral injection HENT: No oropharyngeal obstruction. Some pain with neck rotation and flexion MSK: no major joint deformities.  Cardiovascular: Normal rate and regular rhythm. Perfusing extremities well Respiratory: Effort normal, non-labored breathing GI: Soft.  No distension. There is no tenderness.  Skin: Warm dry and intact visible skin  Neurologic Examination   Mental Status: Patient is awake, alert, oriented to person, place, month, year, and  situation. Patient is able to give a clear and coherent history. No signs of aphasia or neglect Cranial Nerves: II: Visual Fields are full. Pupils are equal, round, and reactive to light.   III,IV, VI: EOMI without ptosis or diploplia.  V: Facial sensation is symmetric to temperature VII: Facial movement is symmetric.  VIII: hearing is intact to voice X: Uvula elevates symmetrically XI: Shoulder shrug is symmetric. XII: tongue is midline without atrophy or fasciculations.  Motor: Slight fasciculations noted in the left hand, possibly 1 in the left biceps.  Question subtle difference in the hypothenar emience vs. normal  variation given more use of the RUE at work  Right Left  Arm abduction  4+  4-  Elbow flexion  5  4  Elbow extension  5  4-  Wrist flexion  5  4-  Wrist extension  5  3  Finger extension   2  Finger flexion      Grip  5  4  Hip flexion    Knee extension    Knee flexion    Foot dorsiflexion    Foot plantarflexion    Foot inversion    Foot eversion    Sensory: Reduced laterally more than medially in the right upper extremity  Deep Tendon Reflexes: Biceps 1+ right, 0 left  Brachioradialis 2+ but slightly diminished right, 0 left  Patellar 2+ and brisk bilaterally  Cerebellar: FNF and HKS are intact bilaterally within limits of weakness on the left  Gait:  Able to rise on heels and toes, normal casual gait other than slightly wide based. Difficulty with tandem gait due to low back pain   Labs/Imaging/Neurodiagnostic studies   CBC:  Recent Labs  Lab 2023/08/13 2212  WBC 5.4  NEUTROABS 2.8  HGB 14.8  HCT 44.0  MCV 87.5  PLT 197   Basic Metabolic Panel:  Lab Results  Component Value Date   NA 140 Aug 13, 2023   K 3.8 08/13/23   CO2 27 2023/08/13   GLUCOSE 97 08/13/2023   BUN 13 08/13/2023   CREATININE 0.98 08/13/23   CALCIUM 8.9 08/13/2023   GFRNONAA >60 13-Aug-2023   Lipid Panel: No results found for: LDLCALC HgbA1c: No results found for: HGBA1C Urine Drug Screen:     Component Value Date/Time   LABOPIA NONE DETECTED 08/13/2023 0720   COCAINSCRNUR POSITIVE (A) 08/13/2023 0720   LABBENZ NONE DETECTED 08/13/2023 0720   AMPHETMU NONE DETECTED 08/13/2023 0720   THCU POSITIVE (A) 08/13/2023 0720   LABBARB NONE DETECTED 08/13/2023 0720    Alcohol Level     Component Value Date/Time   [facility] <15 13-Aug-2023 2212   INR  Lab Results  Component Value Date   INR 1.0 08-13-23   APTT  Lab Results  Component Value Date   APTT 29 08-13-2023   MRI Brain, C-spine, T-spine, personally reviewed, agree with radiology: 1. No acute intracranial abnormality or  abnormal enhancement. 2. C5-6 small disc osteophyte complex with mild spinal canal stenosis and severe bilateral foraminal stenosis. 3. C6-7 left asymmetric disc bulge with severe left foraminal stenosis. 4. C7-T1 small disc bulge with severe bilateral foraminal stenosis.   ASSESSMENT   Neil Parks is a 58 y.o. male presenting with the chief complaint of left hand weakness but on examination also has some mild right deltoid weakness and diminished right biceps reflex in addition to more significant findings on the left upper extremity as detailed above.  These findings best localized to acute cervical spine compressive  pathology especially with history of acute onset while bending down to pick up something with noted worsening of his neck pain after onset.  Suspect there is some cord effect not well seen on MRI as well as the bilateral neuroforaminal stenoses described throughout the lower cervical spine.  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 Claudene via secure chat and phone and Dr. Awanda via secure chat ______________________________________________________________________  Lola Jernigan MD-PhD [facility] [phone] Available 7 AM to 7 PM, outside these hours please contact Neurologist on call listed on AMION

## 2023-08-13 NOTE — H&P (Signed)
 History and Physical    Patient: Neil Parks FMW:[id number] DOB: [birth year] DOA: 08/12/2023 DOS: the patient was seen and examined on 08/13/2023 PCP: Patient, No Pcp Per  Patient coming from: Home  Chief Complaint: No chief complaint on file.  HPI: Neil Parks is a 58 y.o. male with medical history significant of nothing who presents with continued neck and back pain.  He works for [employer] and had a scheduled MRI this past week though he has been out of town.  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  Today at a barbecue he lost control of his left arm and hand and was unable to use it.  He came into the ED and was noted to have significant stenosis at C6-C7 and C7-T1 and neurosurgery was called and recommended the patient remain n.p.o. for possible surgery in the a.m. plus steroids. Review of Systems: As mentioned in the history of present illness. All other systems reviewed and are negative. Past Medical History:  Diagnosis Date   Kidney stones    Past Surgical History:  Procedure Laterality Date   FRACTURE SURGERY     HERNIA REPAIR     Social History:  reports that he has been smoking. He has never used smokeless tobacco. He reports current alcohol use. No history on file for drug use.  No Known Allergies  History reviewed. No pertinent family history.  Prior to Admission medications   Medication Sig Start Date End Date Taking? Authorizing Provider  cyclobenzaprine  (FLEXERIL ) 5 MG tablet Take 1 tablet (5 mg total) by mouth 3 (three) times daily as needed for muscle spasms. 10/20/15   Menshew, Candida LULLA Kings, PA-C  etodolac  (LODINE ) 400 MG tablet Take 1 tablet (400 mg total) by mouth 2 (two) times daily. 10/29/15   Saunders Shona CROME, PA-C  methocarbamol  (ROBAXIN ) 500 MG tablet Take 2 tablets (1,000 mg total) by mouth 4 (four) times daily. 10/29/15   Saunders Shona CROME, PA-C  nabumetone  (RELAFEN ) 750 MG tablet Take 1 tablet (750 mg total) by mouth 2 (two) times daily.  10/20/15   Menshew, Candida LULLA Kings, PA-C    Physical Exam: Vitals:   08/12/23 2111 08/12/23 2112 08/13/23 0038  BP:  132/89 (!) 147/97  Pulse:  82 79  Resp:  17 20  Temp:  98.7 F (37.1 C)   TempSrc:  Oral   SpO2:  97% 100%  Weight: 95.3 kg    Height: 6' (1.829 m)     Physical Examination: General appearance - alert, well appearing, and in no distress Neck - supple, no significant adenopathy Chest -normal effort Abdomen - soft, nontender, nondistended, no masses or organomegaly Neurological - alert, oriented, normal speech, decreased grip strength in the left hand decreased sensation in the left hand and inability to extend the fingers on the hand   Data Reviewed: Results for orders placed or performed during the hospital encounter of 08/12/23 (from the past 24 hours)  Protime-INR     Status: None   Collection Time: 08/12/23 10:12 PM  Result Value Ref Range   Prothrombin Time 13.8 11.4 - 15.2 seconds   INR 1.0 0.8 - 1.2  APTT     Status: None   Collection Time: 08/12/23 10:12 PM  Result Value Ref Range   aPTT 29 24 - 36 seconds  CBC     Status: None   Collection Time: 08/12/23 10:12 PM  Result Value Ref Range   WBC 5.4 4.0 - 10.5 K/uL  RBC 5.03 4.22 - 5.81 MIL/uL   Hemoglobin 14.8 13.0 - 17.0 g/dL   HCT 55.9 60.9 - 47.9 %   MCV 87.5 80.0 - 100.0 fL   MCH 29.4 26.0 - 34.0 pg   MCHC 33.6 30.0 - 36.0 g/dL   RDW 85.0 88.4 - 84.4 %   Platelets 197 150 - 400 K/uL   nRBC 0.0 0.0 - 0.2 %  Differential     Status: None   Collection Time: 08/12/23 10:12 PM  Result Value Ref Range   Neutrophils Relative % 51 %   Neutro Abs 2.8 1.7 - 7.7 K/uL   Lymphocytes Relative 32 %   Lymphs Abs 1.7 0.7 - 4.0 K/uL   Monocytes Relative 10 %   Monocytes Absolute 0.5 0.1 - 1.0 K/uL   Eosinophils Relative 6 %   Eosinophils Absolute 0.3 0.0 - 0.5 K/uL   Basophils Relative 1 %   Basophils Absolute 0.1 0.0 - 0.1 K/uL   Immature Granulocytes 0 %   Abs Immature Granulocytes 0.01 0.00 -  0.07 K/uL  Comprehensive metabolic panel     Status: Abnormal   Collection Time: 08/12/23 10:12 PM  Result Value Ref Range   Sodium 140 135 - 145 mmol/L   Potassium 3.8 3.5 - 5.1 mmol/L   Chloride 104 98 - 111 mmol/L   CO2 27 22 - 32 mmol/L   Glucose, Bld 97 70 - 99 mg/dL   BUN 13 6 - 20 mg/dL   Creatinine, Ser 9.01 0.61 - 1.24 mg/dL   Calcium 8.9 8.9 - 89.6 mg/dL   Total Protein 8.1 6.5 - 8.1 g/dL   Albumin 3.6 3.5 - 5.0 g/dL   AST 90 (H) 15 - 41 U/L   ALT 47 (H) 0 - 44 U/L   Alkaline Phosphatase 117 38 - 126 U/L   Total Bilirubin 0.7 0.0 - 1.2 mg/dL   GFR, Estimated >39 >39 mL/min   Anion gap 9 5 - 15  Ethanol     Status: None   Collection Time: 08/12/23 10:12 PM  Result Value Ref Range   Alcohol, Ethyl (B) <15 <15 mg/dL   MR Cervical Spine W and Wo Contrast Result Date: 08/13/2023 EXAM: MRI BRAIN WITHOUT CONTRAST MRI CERVICAL SPINE WITHOUT AND WITH CONTRAST MRI THORACIC SPINE WITHOUT AND WITH CONTRAST 08/12/2023 11:54:41 PM TECHNIQUE: Multiplanar multisequence MRI of the brain was performed without the administration of intravenous contrast. Multiplanar multisequence MRI of the cervical and thoracic spine was performed without and with the administration of intravenous contrast. CONTRAST: 10mL gadavist  COMPARISON: None available. CLINICAL HISTORY: Unable to flex left hand, most consistent with radiculopathy, but risk factors for CVA. FINDINGS: MRI BRAIN: BRAIN AND VENTRICLES: No acute infarct. No acute intracranial hemorrhage. No mass or abnormal enhancement. No midline shift. No hydrocephalus. The sella is unremarkable. Normal flow voids. ORBITS: No acute abnormality. SINUSES AND MASTOIDS: Mild ethmoid sinus mucosal thickening. BONES AND SOFT TISSUES: Normal bone marrow signal. No acute soft tissue abnormality. MRI CERVICAL AND THORACIC SPINE: BONES AND ALIGNMENT: Mild degenerative height loss at the C4-6 levels. No abnormal enhancement. SPINAL CORD: No focal lesion of the spinal cord.  SOFT TISSUES: Unremarkable. C2-C3: No significant disc herniation. No spinal canal stenosis or neural foraminal narrowing. C3-C4: Small central disc protrusion without stenosis. C4-C5: No significant disc herniation. No spinal canal stenosis or neural foraminal narrowing. C5-C6: Small disc osteophyte complex with mild spinal canal stenosis and severe bilateral foraminal stenosis. C6-C7: Left asymmetric disc bulge with severe left  foraminal stenosis. C7-T1: Small disc bulge with severe bilateral foraminal stenosis. No disk herniation, spinal canal stenosis or neural foraminal stenosis of the thoracic spine. IMPRESSION: 1. No acute intracranial abnormality or abnormal enhancement. 2. C5-6 small disc osteophyte complex with mild spinal canal stenosis and severe bilateral foraminal stenosis. 3. C6-7 left asymmetric disc bulge with severe left foraminal stenosis. 4. C7-T1 small disc bulge with severe bilateral foraminal stenosis. Electronically signed by: Franky Stanford MD 08/13/2023 12:18 AM EDT RP Workstation: [id number]   MR THORACIC SPINE W WO CONTRAST Result Date: 08/13/2023 EXAM: MRI BRAIN WITHOUT CONTRAST MRI CERVICAL SPINE WITHOUT AND WITH CONTRAST MRI THORACIC SPINE WITHOUT AND WITH CONTRAST 08/12/2023 11:54:41 PM TECHNIQUE: Multiplanar multisequence MRI of the brain was performed without the administration of intravenous contrast. Multiplanar multisequence MRI of the cervical and thoracic spine was performed without and with the administration of intravenous contrast. CONTRAST: 10mL gadavist  COMPARISON: None available. CLINICAL HISTORY: Unable to flex left hand, most consistent with radiculopathy, but risk factors for CVA. FINDINGS: MRI BRAIN: BRAIN AND VENTRICLES: No acute infarct. No acute intracranial hemorrhage. No mass or abnormal enhancement. No midline shift. No hydrocephalus. The sella is unremarkable. Normal flow voids. ORBITS: No acute abnormality. SINUSES AND MASTOIDS: Mild ethmoid sinus mucosal  thickening. BONES AND SOFT TISSUES: Normal bone marrow signal. No acute soft tissue abnormality. MRI CERVICAL AND THORACIC SPINE: BONES AND ALIGNMENT: Mild degenerative height loss at the C4-6 levels. No abnormal enhancement. SPINAL CORD: No focal lesion of the spinal cord. SOFT TISSUES: Unremarkable. C2-C3: No significant disc herniation. No spinal canal stenosis or neural foraminal narrowing. C3-C4: Small central disc protrusion without stenosis. C4-C5: No significant disc herniation. No spinal canal stenosis or neural foraminal narrowing. C5-C6: Small disc osteophyte complex with mild spinal canal stenosis and severe bilateral foraminal stenosis. C6-C7: Left asymmetric disc bulge with severe left foraminal stenosis. C7-T1: Small disc bulge with severe bilateral foraminal stenosis. No disk herniation, spinal canal stenosis or neural foraminal stenosis of the thoracic spine. IMPRESSION: 1. No acute intracranial abnormality or abnormal enhancement. 2. C5-6 small disc osteophyte complex with mild spinal canal stenosis and severe bilateral foraminal stenosis. 3. C6-7 left asymmetric disc bulge with severe left foraminal stenosis. 4. C7-T1 small disc bulge with severe bilateral foraminal stenosis. Electronically signed by: Franky Stanford MD 08/13/2023 12:18 AM EDT RP Workstation: [id number]   MR BRAIN WO CONTRAST Result Date: 08/13/2023 EXAM: MRI BRAIN WITHOUT CONTRAST MRI CERVICAL SPINE WITHOUT AND WITH CONTRAST MRI THORACIC SPINE WITHOUT AND WITH CONTRAST 08/12/2023 11:54:41 PM TECHNIQUE: Multiplanar multisequence MRI of the brain was performed without the administration of intravenous contrast. Multiplanar multisequence MRI of the cervical and thoracic spine was performed without and with the administration of intravenous contrast. CONTRAST: 10mL gadavist  COMPARISON: None available. CLINICAL HISTORY: Unable to flex left hand, most consistent with radiculopathy, but risk factors for CVA. FINDINGS: MRI BRAIN: BRAIN  AND VENTRICLES: No acute infarct. No acute intracranial hemorrhage. No mass or abnormal enhancement. No midline shift. No hydrocephalus. The sella is unremarkable. Normal flow voids. ORBITS: No acute abnormality. SINUSES AND MASTOIDS: Mild ethmoid sinus mucosal thickening. BONES AND SOFT TISSUES: Normal bone marrow signal. No acute soft tissue abnormality. MRI CERVICAL AND THORACIC SPINE: BONES AND ALIGNMENT: Mild degenerative height loss at the C4-6 levels. No abnormal enhancement. SPINAL CORD: No focal lesion of the spinal cord. SOFT TISSUES: Unremarkable. C2-C3: No significant disc herniation. No spinal canal stenosis or neural foraminal narrowing. C3-C4: Small central disc protrusion without stenosis. C4-C5: No significant disc herniation. No  spinal canal stenosis or neural foraminal narrowing. C5-C6: Small disc osteophyte complex with mild spinal canal stenosis and severe bilateral foraminal stenosis. C6-C7: Left asymmetric disc bulge with severe left foraminal stenosis. C7-T1: Small disc bulge with severe bilateral foraminal stenosis. No disk herniation, spinal canal stenosis or neural foraminal stenosis of the thoracic spine. IMPRESSION: 1. No acute intracranial abnormality or abnormal enhancement. 2. C5-6 small disc osteophyte complex with mild spinal canal stenosis and severe bilateral foraminal stenosis. 3. C6-7 left asymmetric disc bulge with severe left foraminal stenosis. 4. C7-T1 small disc bulge with severe bilateral foraminal stenosis. Electronically signed by: Franky Stanford MD 08/13/2023 12:18 AM EDT RP Workstation: [id number]   CT HEAD WO CONTRAST Result Date: 08/12/2023 EXAM: CT HEAD WITHOUT CONTRAST 08/12/2023 09:58:42 PM TECHNIQUE: CT of the head was performed without the administration of intravenous contrast. Automated exposure control, iterative reconstruction, and/or weight based adjustment of the mA/kV was utilized to reduce the radiation dose to as low as reasonably achievable.  COMPARISON: None available. CLINICAL HISTORY: Numbness or tingling, paresthesia (Ped 0-17y). Pt complaints of left arm pain and numbness; Per pt I was supposed to have an mri of my back on 7/18 but didn't go. I was moving some clothes (nothing heavy) went and sat on the porch started to pick up my cigar and my arm just stopped working right.; Per pt this all happened around noon today. FINDINGS: BRAIN AND VENTRICLES: No acute hemorrhage. Gray-white differentiation is preserved. No hydrocephalus. No extra-axial collection. No mass effect or midline shift. ORBITS: No acute abnormality. SINUSES: Mild paranasal sinus mucosal thickening. SOFT TISSUES AND SKULL: No acute soft tissue abnormality. No skull fracture. IMPRESSION: 1. No acute intracranial abnormality. Electronically signed by: Franky Stanford MD 08/12/2023 10:02 PM EDT RP Workstation: [id number]     Assessment and Plan: * Radiculopathy affecting upper extremity Per neurosurgery, Decadron  taper 4 mg IV (8 hours 8 3 mg IV then 8 hours 2 mg IV then in 8 hours 1 mg IV N.p.o. after midnight for possible surgery in the morning      Advance Care Planning:   Code Status: Not on file Full  Consults: Neurosurgery  Family Communication: Patient at bedside  Severity of Illness: The appropriate patient status for this patient is INPATIENT. Inpatient status is judged to be reasonable and necessary in order to provide the required intensity of service to ensure the patient's safety. The patient's presenting symptoms, physical exam findings, and initial radiographic and laboratory data in the context of their chronic comorbidities is felt to place them at high risk for further clinical deterioration. Furthermore, it is not anticipated that the patient will be medically stable for discharge from the hospital within 2 midnights of admission.   * I certify that at the point of admission it is my clinical judgment that the patient will require inpatient  hospital care spanning beyond 2 midnights from the point of admission due to high intensity of service, high risk for further deterioration and high frequency of surveillance required.*  Author: Glenys GORMAN Birk, MD 08/13/2023 12:48 AM  For on call review [email or web address].

## 2023-08-13 NOTE — Consult Note (Signed)
 Consulting Department:  Inpatient medicine  Primary Physician:  Patient, No Pcp Per  Chief Complaint: Left arm weakness  History of Present Illness: 08/13/2023 Neil Parks is a 58 y.o. male who presents with the chief complaint of acute onset of left arm weakness.  He has a history of chronic neck and back pain.  He was recently put on restricted/light duty for worsening symptoms.  He was here for a family reunion yesterday when he noted that while reaching down to pick up a cigar he was unable to lift his left wrist.  He did not have any severe pain but did have significant weakness.  He has not had any progressive signs or symptoms consistent with myelopathy.  Most of his symptoms are unilateral on the left side.  He is also has some tingling and sensation changes.  The symptoms are causing a significant impact on the patient's life.   Review of Systems:  A 10 point review of systems is negative, except for the pertinent positives and negatives detailed in the HPI.  Past Medical History: Past Medical History:  Diagnosis Date   Kidney stones     Past Surgical History: Past Surgical History:  Procedure Laterality Date   FRACTURE SURGERY     HERNIA REPAIR      Allergies: Allergies as of 08/12/2023   (No Known Allergies)    Medications:  Current Facility-Administered Medications:    [START ON 08/14/2023] dexamethasone  (DECADRON ) injection 1 mg, 1 mg, Intravenous, Once, Fredirick Glenys RAMAN, MD   dexamethasone  (DECADRON ) injection 2 mg, 2 mg, Intravenous, Once, Fredirick Glenys RAMAN, MD   fentaNYL  (SUBLIMAZE ) injection 12.5-50 mcg, 12.5-50 mcg, Intravenous, Q2H PRN, Pratt, Tanya S, MD   metoprolol  tartrate (LOPRESSOR ) injection 5 mg, 5 mg, Intravenous, Q6H PRN, Fredirick Glenys RAMAN, MD   Social History: Social History   Tobacco Use   Smoking status: Every Day   Smokeless tobacco: Never  Substance Use Topics   Alcohol use: Yes    Family Medical History: History reviewed. No  pertinent family history.  Physical Examination: Vitals:   08/13/23 0140 08/13/23 0724  BP: 130/79 130/81  Pulse: 68 (!) 52  Resp: 18 16  Temp: 97.9 F (36.6 C) 97.8 F (36.6 C)  SpO2: 99% 96%     General: Patient is well developed, well nourished, calm, collected, and in no apparent distress.  NEUROLOGICAL:  General: In no acute distress.   Awake, alert, oriented to person, place, and time.  Pupils equal round and reactive to light.  Facial tone is symmetric.  Tongue protrusion is midline.  There is no pronator drift.  Strength: Side Biceps Triceps Deltoid Interossei Grip Wrist Ext. Wrist Flex.  R 5 5 5 5 5 5 5   L 4+ 4 4+ 1-2 1-2 1 3     Reflexes are absent in the left biceps and brachioradialis, he has some preservation of his left pectoralis reflex.  Tricep is decreased.  Right sided reflexes are 1-2+.  Clonus is not present.     Imaging: CT CERVICAL SPINE WO CONTRAST Result Date: 08/13/2023 CLINICAL DATA:  Cervical radiculopathy. Continued neck and back pain. Loss of control of left arm and hand. EXAM: CT CERVICAL SPINE WITHOUT CONTRAST TECHNIQUE: Multidetector CT imaging of the cervical spine was performed without intravenous contrast. Multiplanar CT image reconstructions were also generated. RADIATION DOSE REDUCTION: This exam was performed according to the departmental dose-optimization program which includes automated exposure control, adjustment of the mA and/or kV according to patient size  and/or use of iterative reconstruction technique. COMPARISON:  MR cervical spine 08/12/2023. CT cervical spine 10/29/2015. FINDINGS: Alignment: Straightening without focal angulation or listhesis. Skull base and vertebrae: No evidence of acute cervical spine fracture or traumatic subluxation. Chronic endplate degenerative changes have mildly progressed compared with the remote CT from 2017, most advanced from C4-5 through C7-T1. Chronic ossification of the ligamentum nuchae. Soft tissues  and spinal canal: No prevertebral fluid or swelling. No visible canal hematoma. Disc levels: As correlated with recent MRI, and no significant spinal stenosis, foraminal narrowing or nerve root impingement at C2-3 or C3-4. C4-5: Moderate loss of disc height with disc bulging, endplate osteophytes and bilateral uncinate spurring. Mild foraminal narrowing bilaterally. C5-6: Moderate loss of disc height with posterior osteophytes covering diffusely bulging disc material. Resulting mild to moderate multifactorial spinal stenosis and moderate to severe foraminal narrowing bilaterally, as seen on MRI. C6-7: Moderate loss of disc height with posterior osteophytes covering diffusely bulging disc material. Asymmetric uncinate spurring on the left with moderate to severe left foraminal narrowing and possible left C7 nerve root impingement. C7-T1: Advanced loss of disc height with posterior osteophytes covering diffusely bulging disc material. Asymmetric disc osteophyte complex within the left foramen with resulting severe left foraminal narrowing and probable left C8 nerve root impingement. Moderate to severe right foraminal narrowing. Upper chest: Clear lung apices. Other: None. IMPRESSION: 1. No evidence of acute cervical spine fracture, traumatic subluxation or static signs of instability. 2. Multilevel cervical spondylosis as described, mildly progressed compared with remote CT from 2017. 3. Individual disc space levels are detailed on previous day MRI. Mild to moderate multifactorial spinal stenosis at C5-6. Multilevel foraminal narrowing as described, greatest C5-6, C6-7 and C7-T1. Electronically Signed   By: Elsie Perone M.D.   On: 08/13/2023 11:34   DG Cervical Spine With Flex & Extend Result Date: 08/13/2023 EXAM: Flexion and Extension VIEW(S) XRAY OF THE CERVICAL SPINE 08/13/2023 01:27:00 AM COMPARISON: None available. CLINICAL HISTORY: Evaluate for radiculopathy. Pt states that he was injured at work on  07/18/23 and has been having neck and left arm pain since. FINDINGS: BONES: No acute fracture. No aggressive appearing osseous lesion. Alignment is normal. No abnormal motion with flexion/extension. DISCS AND DEGENERATIVE CHANGES: Mild degenerative changes in the mid/lower cervical spine. Mild narrowing of the right C5-6 neural foramen. Moderate narrowing of the right C7-T1 neural foramen. Evaluation of the left neural foramina is constrained by obliquity, but mild narrowing is suspected at C5-6. SOFT TISSUES: No prevertebral soft tissue swelling. The visualized lungs appear clear. IMPRESSION: 1. No abnormal motion with flexion/extension. 2. Mild degenerative changes in the mid/lower cervical spine. 3. Bilateral neural foraminal narrowing, as above. Electronically signed by: Pinkie Pebbles MD 08/13/2023 01:38 AM EDT RP Workstation: [id number]   MR Cervical Spine W and Wo Contrast Result Date: 08/13/2023 EXAM: MRI BRAIN WITHOUT CONTRAST MRI CERVICAL SPINE WITHOUT AND WITH CONTRAST MRI THORACIC SPINE WITHOUT AND WITH CONTRAST 08/12/2023 11:54:41 PM TECHNIQUE: Multiplanar multisequence MRI of the brain was performed without the administration of intravenous contrast. Multiplanar multisequence MRI of the cervical and thoracic spine was performed without and with the administration of intravenous contrast. CONTRAST: 10mL gadavist  COMPARISON: None available. CLINICAL HISTORY: Unable to flex left hand, most consistent with radiculopathy, but risk factors for CVA. FINDINGS: MRI BRAIN: BRAIN AND VENTRICLES: No acute infarct. No acute intracranial hemorrhage. No mass or abnormal enhancement. No midline shift. No hydrocephalus. The sella is unremarkable. Normal flow voids. ORBITS: No acute abnormality. SINUSES AND MASTOIDS: Mild  ethmoid sinus mucosal thickening. BONES AND SOFT TISSUES: Normal bone marrow signal. No acute soft tissue abnormality. MRI CERVICAL AND THORACIC SPINE: BONES AND ALIGNMENT: Mild degenerative height  loss at the C4-6 levels. No abnormal enhancement. SPINAL CORD: No focal lesion of the spinal cord. SOFT TISSUES: Unremarkable. C2-C3: No significant disc herniation. No spinal canal stenosis or neural foraminal narrowing. C3-C4: Small central disc protrusion without stenosis. C4-C5: No significant disc herniation. No spinal canal stenosis or neural foraminal narrowing. C5-C6: Small disc osteophyte complex with mild spinal canal stenosis and severe bilateral foraminal stenosis. C6-C7: Left asymmetric disc bulge with severe left foraminal stenosis. C7-T1: Small disc bulge with severe bilateral foraminal stenosis. No disk herniation, spinal canal stenosis or neural foraminal stenosis of the thoracic spine. IMPRESSION: 1. No acute intracranial abnormality or abnormal enhancement. 2. C5-6 small disc osteophyte complex with mild spinal canal stenosis and severe bilateral foraminal stenosis. 3. C6-7 left asymmetric disc bulge with severe left foraminal stenosis. 4. C7-T1 small disc bulge with severe bilateral foraminal stenosis. Electronically signed by: Franky Stanford MD 08/13/2023 12:18 AM EDT RP Workstation: [id number]   MR THORACIC SPINE W WO CONTRAST Result Date: 08/13/2023 EXAM: MRI BRAIN WITHOUT CONTRAST MRI CERVICAL SPINE WITHOUT AND WITH CONTRAST MRI THORACIC SPINE WITHOUT AND WITH CONTRAST 08/12/2023 11:54:41 PM TECHNIQUE: Multiplanar multisequence MRI of the brain was performed without the administration of intravenous contrast. Multiplanar multisequence MRI of the cervical and thoracic spine was performed without and with the administration of intravenous contrast. CONTRAST: 10mL gadavist  COMPARISON: None available. CLINICAL HISTORY: Unable to flex left hand, most consistent with radiculopathy, but risk factors for CVA. FINDINGS: MRI BRAIN: BRAIN AND VENTRICLES: No acute infarct. No acute intracranial hemorrhage. No mass or abnormal enhancement. No midline shift. No hydrocephalus. The sella is unremarkable.  Normal flow voids. ORBITS: No acute abnormality. SINUSES AND MASTOIDS: Mild ethmoid sinus mucosal thickening. BONES AND SOFT TISSUES: Normal bone marrow signal. No acute soft tissue abnormality. MRI CERVICAL AND THORACIC SPINE: BONES AND ALIGNMENT: Mild degenerative height loss at the C4-6 levels. No abnormal enhancement. SPINAL CORD: No focal lesion of the spinal cord. SOFT TISSUES: Unremarkable. C2-C3: No significant disc herniation. No spinal canal stenosis or neural foraminal narrowing. C3-C4: Small central disc protrusion without stenosis. C4-C5: No significant disc herniation. No spinal canal stenosis or neural foraminal narrowing. C5-C6: Small disc osteophyte complex with mild spinal canal stenosis and severe bilateral foraminal stenosis. C6-C7: Left asymmetric disc bulge with severe left foraminal stenosis. C7-T1: Small disc bulge with severe bilateral foraminal stenosis. No disk herniation, spinal canal stenosis or neural foraminal stenosis of the thoracic spine. IMPRESSION: 1. No acute intracranial abnormality or abnormal enhancement. 2. C5-6 small disc osteophyte complex with mild spinal canal stenosis and severe bilateral foraminal stenosis. 3. C6-7 left asymmetric disc bulge with severe left foraminal stenosis. 4. C7-T1 small disc bulge with severe bilateral foraminal stenosis. Electronically signed by: Franky Stanford MD 08/13/2023 12:18 AM EDT RP Workstation: [id number]   MR BRAIN WO CONTRAST Result Date: 08/13/2023 EXAM: MRI BRAIN WITHOUT CONTRAST MRI CERVICAL SPINE WITHOUT AND WITH CONTRAST MRI THORACIC SPINE WITHOUT AND WITH CONTRAST 08/12/2023 11:54:41 PM TECHNIQUE: Multiplanar multisequence MRI of the brain was performed without the administration of intravenous contrast. Multiplanar multisequence MRI of the cervical and thoracic spine was performed without and with the administration of intravenous contrast. CONTRAST: 10mL gadavist  COMPARISON: None available. CLINICAL HISTORY: Unable to flex  left hand, most consistent with radiculopathy, but risk factors for CVA. FINDINGS: MRI BRAIN: BRAIN AND VENTRICLES: No  acute infarct. No acute intracranial hemorrhage. No mass or abnormal enhancement. No midline shift. No hydrocephalus. The sella is unremarkable. Normal flow voids. ORBITS: No acute abnormality. SINUSES AND MASTOIDS: Mild ethmoid sinus mucosal thickening. BONES AND SOFT TISSUES: Normal bone marrow signal. No acute soft tissue abnormality. MRI CERVICAL AND THORACIC SPINE: BONES AND ALIGNMENT: Mild degenerative height loss at the C4-6 levels. No abnormal enhancement. SPINAL CORD: No focal lesion of the spinal cord. SOFT TISSUES: Unremarkable. C2-C3: No significant disc herniation. No spinal canal stenosis or neural foraminal narrowing. C3-C4: Small central disc protrusion without stenosis. C4-C5: No significant disc herniation. No spinal canal stenosis or neural foraminal narrowing. C5-C6: Small disc osteophyte complex with mild spinal canal stenosis and severe bilateral foraminal stenosis. C6-C7: Left asymmetric disc bulge with severe left foraminal stenosis. C7-T1: Small disc bulge with severe bilateral foraminal stenosis. No disk herniation, spinal canal stenosis or neural foraminal stenosis of the thoracic spine. IMPRESSION: 1. No acute intracranial abnormality or abnormal enhancement. 2. C5-6 small disc osteophyte complex with mild spinal canal stenosis and severe bilateral foraminal stenosis. 3. C6-7 left asymmetric disc bulge with severe left foraminal stenosis. 4. C7-T1 small disc bulge with severe bilateral foraminal stenosis. Electronically signed by: Franky Stanford MD 08/13/2023 12:18 AM EDT RP Workstation: [id number]   CT HEAD WO CONTRAST Result Date: 08/12/2023 EXAM: CT HEAD WITHOUT CONTRAST 08/12/2023 09:58:42 PM TECHNIQUE: CT of the head was performed without the administration of intravenous contrast. Automated exposure control, iterative reconstruction, and/or weight based adjustment  of the mA/kV was utilized to reduce the radiation dose to as low as reasonably achievable. COMPARISON: None available. CLINICAL HISTORY: Numbness or tingling, paresthesia (Ped 0-17y). Pt complaints of left arm pain and numbness; Per pt I was supposed to have an mri of my back on 7/18 but didn't go. I was moving some clothes (nothing heavy) went and sat on the porch started to pick up my cigar and my arm just stopped working right.; Per pt this all happened around noon today. FINDINGS: BRAIN AND VENTRICLES: No acute hemorrhage. Gray-white differentiation is preserved. No hydrocephalus. No extra-axial collection. No mass effect or midline shift. ORBITS: No acute abnormality. SINUSES: Mild paranasal sinus mucosal thickening. SOFT TISSUES AND SKULL: No acute soft tissue abnormality. No skull fracture. IMPRESSION: 1. No acute intracranial abnormality. Electronically signed by: Franky Stanford MD 08/12/2023 10:02 PM EDT RP Workstation: [id number]     I have personally reviewed the images and agree with the above interpretation.  Labs:    Latest Ref Rng & Units 08/12/2023   10:12 PM  CBC  WBC 4.0 - 10.5 K/uL 5.4   Hemoglobin 13.0 - 17.0 g/dL 85.1   Hematocrit 60.9 - 52.0 % 44.0   Platelets 150 - 400 K/uL 197       Latest Ref Rng & Units 08/12/2023   10:12 PM  BMP  Glucose 70 - 99 mg/dL 97   BUN 6 - 20 mg/dL 13   Creatinine 9.38 - 1.24 mg/dL 9.01   Sodium 864 - 854 mmol/L 140   Potassium 3.5 - 5.1 mmol/L 3.8   Chloride 98 - 111 mmol/L 104   CO2 22 - 32 mmol/L 27   Calcium 8.9 - 10.3 mg/dL 8.9     INR  1.0 (92/79 2212)   Assessment and Plan: Mr. Seth is a pleasant 58 y.o. male with history of cervical stenosis lumbar stenosis chronic neck and back pain.  He was here in [place]  for family reunion.  He noticed  yesterday that when he was attempting to pick up his a car he was unable to lift his hand.  He mostly uses his right side so did not know when this started exactly but as this  was persistent he came into the emergency department for evaluation.  Had extensive workup for concerns for stroke inflammatory disease, however part of his workup included cross-sectional imaging of his spine which demonstrated severe foraminal stenosis bilaterally most prominent from C5-T1.  He showed a significant dense C6-C8 type presentation with some more mild C5 symptoms.  He does show some slight weakness on the right unclear whether or not this is give way.  Given the density of his deficits I asked that he be seen by neurology for evaluation for possible peripheral etiology as he did have some fasciculations noted however these were mostly in his extensor compartment.  I spoke directly with my neurology colleague who felt that the highest likelihood was a cervical radiculopathy multiple levels.  It is unclear whether or not the patient had any trauma or any inciting event that pushed him over the edge from his severe cervical stenosis.  He was positive for cocaine so potential ischemic event at the hyper stenotic foramina could be possible.  We discussed and felt that his areas of spinal compression were most severe at the C5-T1 levels and plan to move forward with a C5-T1 anterior cervical discectomy and fusion in the morning.  I discussed the risk and benefits of the procedure with the patient.  I did discuss that since his deficit was so severe I was unsure his likelihood for recovery.  I also discussed but not limited to swallowing problems, breathing problems, vascular injuries, given his recent cocaine use higher risk for perioperative complications.   Penne MICAEL Sharps, MD/MSCR Dept. of Neurosurgery

## 2023-08-13 NOTE — Hospital Course (Signed)
 Patient is a 58 year old male with continued neck and back pain.  He works for [employer] and had a scheduled MRI this past week though he has been out of town.  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  Today at a barbecue he lost control of his left arm and hand and was unable to use it.  He came into the ED and was noted to have significant stenosis at C6-C7 and C7-T1 and neurosurgery was called and recommended the patient remain n.p.o. for possible surgery in the a.m. plus steroids.

## 2023-08-13 NOTE — Assessment & Plan Note (Signed)
 Per neurosurgery, Decadron  taper 4 mg IV (8 hours 8 3 mg IV then 8 hours 2 mg IV then in 8 hours 1 mg IV N.p.o. after midnight for possible surgery in the morning

## 2023-08-14 ENCOUNTER — Inpatient Hospital Stay: Payer: Self-pay | Admitting: Certified Registered"

## 2023-08-14 ENCOUNTER — Inpatient Hospital Stay: Payer: Self-pay

## 2023-08-14 ENCOUNTER — Other Ambulatory Visit: Payer: Self-pay

## 2023-08-14 ENCOUNTER — Encounter
Admission: EM | Disposition: A | Discharge status: Home / Self Care | Payer: Self-pay | Source: Home / Self Care | Attending: Hospitalist

## 2023-08-14 ENCOUNTER — Encounter: Payer: Self-pay | Admitting: Family Medicine

## 2023-08-14 HISTORY — PX: ANTERIOR CERVICAL DECOMP/DISCECTOMY FUSION: SHX1161

## 2023-08-14 LAB — RPR: RPR Ser Ql: NONREACTIVE

## 2023-08-14 SURGERY — ANTERIOR CERVICAL DECOMPRESSION/DISCECTOMY FUSION 3 LEVEL/HARDWARE REMOVAL
Anesthesia: General | Site: Spine Cervical

## 2023-08-14 MED ORDER — MENTHOL 3 MG MT LOZG
1.0000 | LOZENGE | OROMUCOSAL | Status: DC | PRN
  Administered 2023-08-14: 3 mg via ORAL
  Filled 2023-08-14: qty 9

## 2023-08-14 MED ORDER — PROPOFOL 10 MG/ML IV BOLUS
INTRAVENOUS | Status: AC
  Filled 2023-08-14: qty 20

## 2023-08-14 MED ORDER — ONDANSETRON HCL 4 MG PO TABS: 4.0000 mg | ORAL_TABLET | Freq: Four times a day (QID) | ORAL | Status: DC | PRN

## 2023-08-14 MED ORDER — ONDANSETRON HCL 4 MG/2ML IJ SOLN: 4.0000 mg | Freq: Four times a day (QID) | INTRAMUSCULAR | Status: DC | PRN

## 2023-08-14 MED ORDER — DEXMEDETOMIDINE HCL IN NACL 80 MCG/20ML IV SOLN
INTRAVENOUS | Status: DC | PRN
  Administered 2023-08-14: 8 ug via INTRAVENOUS
  Administered 2023-08-14: 12 ug via INTRAVENOUS

## 2023-08-14 MED ORDER — PROPOFOL 1000 MG/100ML IV EMUL
INTRAVENOUS | Status: AC
  Filled 2023-08-14: qty 100

## 2023-08-14 MED ORDER — METHOCARBAMOL 500 MG PO TABS
500.0000 mg | ORAL_TABLET | Freq: Four times a day (QID) | ORAL | Status: DC | PRN
  Administered 2023-08-15 – 2023-08-16 (×2): 500 mg via ORAL
  Filled 2023-08-14 (×2): qty 1

## 2023-08-14 MED ORDER — BUPIVACAINE-EPINEPHRINE (PF) 0.5% -1:200000 IJ SOLN
INTRAMUSCULAR | Status: DC | PRN
Start: 2023-08-14 — End: 2023-08-14
  Administered 2023-08-14: 7 mL via PERINEURAL

## 2023-08-14 MED ORDER — DEXAMETHASONE SODIUM PHOSPHATE 10 MG/ML IJ SOLN
INTRAMUSCULAR | Status: DC | PRN
  Administered 2023-08-14: 10 mg via INTRAVENOUS

## 2023-08-14 MED ORDER — REMIFENTANIL HCL 1 MG IV SOLR
INTRAVENOUS | Status: DC | PRN
  Administered 2023-08-14: .1 ug/kg/min via INTRAVENOUS

## 2023-08-14 MED ORDER — KETAMINE HCL 50 MG/5ML IJ SOSY
PREFILLED_SYRINGE | INTRAMUSCULAR | Status: AC
  Filled 2023-08-14: qty 5

## 2023-08-14 MED ORDER — REMIFENTANIL HCL 1 MG IV SOLR
INTRAVENOUS | Status: AC
  Filled 2023-08-14: qty 1000

## 2023-08-14 MED ORDER — CHLORHEXIDINE GLUCONATE 0.12 % MT SOLN
OROMUCOSAL | Status: AC
  Filled 2023-08-14: qty 15

## 2023-08-14 MED ORDER — SORBITOL 70 % SOLN: 30.0000 mL | Freq: Every day | Status: DC | PRN

## 2023-08-14 MED ORDER — SODIUM CHLORIDE 0.9% FLUSH: 3.0000 mL | INTRAVENOUS | Status: DC | PRN

## 2023-08-14 MED ORDER — METHOCARBAMOL 1000 MG/10ML IJ SOLN: 500.0000 mg | Freq: Four times a day (QID) | INTRAMUSCULAR | Status: DC | PRN

## 2023-08-14 MED ORDER — PROPOFOL 10 MG/ML IV BOLUS
INTRAVENOUS | Status: DC | PRN
  Administered 2023-08-14: 200 mg via INTRAVENOUS
  Administered 2023-08-14: 50 mg via INTRAVENOUS

## 2023-08-14 MED ORDER — ONDANSETRON HCL 4 MG/2ML IJ SOLN
INTRAMUSCULAR | Status: DC | PRN
Start: 2023-08-14 — End: 2023-08-14
  Administered 2023-08-14: 4 mg via INTRAVENOUS

## 2023-08-14 MED ORDER — PHENOL 1.4 % MT LIQD: 1.0000 | OROMUCOSAL | Status: DC | PRN

## 2023-08-14 MED ORDER — CEFAZOLIN SODIUM-DEXTROSE 2-4 GM/100ML-% IV SOLN
2.0000 g | INTRAVENOUS | Status: AC
  Administered 2023-08-14: 2 g via INTRAVENOUS

## 2023-08-14 MED ORDER — CHLORHEXIDINE GLUCONATE 0.12 % MT SOLN
15.0000 mL | Freq: Once | OROMUCOSAL | Status: AC
  Administered 2023-08-14: 15 mL via OROMUCOSAL

## 2023-08-14 MED ORDER — EPHEDRINE SULFATE-NACL 50-0.9 MG/10ML-% IV SOSY
PREFILLED_SYRINGE | INTRAVENOUS | Status: DC | PRN
  Administered 2023-08-14 (×2): 5 mg via INTRAVENOUS

## 2023-08-14 MED ORDER — ACETAMINOPHEN 650 MG RE SUPP: 650.0000 mg | RECTAL | Status: DC | PRN

## 2023-08-14 MED ORDER — CEFAZOLIN SODIUM-DEXTROSE 2-4 GM/100ML-% IV SOLN
INTRAVENOUS | Status: AC
  Filled 2023-08-14: qty 100

## 2023-08-14 MED ORDER — ACETAMINOPHEN 10 MG/ML IV SOLN
INTRAVENOUS | Status: DC | PRN
  Administered 2023-08-14: 1000 mg via INTRAVENOUS

## 2023-08-14 MED ORDER — POLYETHYLENE GLYCOL 3350 17 G PO PACK: 17.0000 g | PACK | Freq: Every day | ORAL | Status: DC | PRN

## 2023-08-14 MED ORDER — PHENYLEPHRINE HCL-NACL 20-0.9 MG/250ML-% IV SOLN
INTRAVENOUS | Status: DC | PRN
Start: 2023-08-14 — End: 2023-08-14
  Administered 2023-08-14: 20 ug/min via INTRAVENOUS

## 2023-08-14 MED ORDER — GLYCOPYRROLATE 0.2 MG/ML IJ SOLN
INTRAMUSCULAR | Status: DC | PRN
  Administered 2023-08-14: .2 mg via INTRAVENOUS

## 2023-08-14 MED ORDER — OXYCODONE HCL 5 MG PO TABS: 5.0000 mg | ORAL_TABLET | ORAL | Status: DC | PRN

## 2023-08-14 MED ORDER — SODIUM CHLORIDE 0.9% FLUSH
3.0000 mL | Freq: Two times a day (BID) | INTRAVENOUS | Status: DC
  Administered 2023-08-14 – 2023-08-16 (×4): 3 mL via INTRAVENOUS

## 2023-08-14 MED ORDER — LIDOCAINE HCL (CARDIAC) PF 100 MG/5ML IV SOSY
PREFILLED_SYRINGE | INTRAVENOUS | Status: DC | PRN
  Administered 2023-08-14: 80 mg via INTRAVENOUS

## 2023-08-14 MED ORDER — OXYCODONE HCL 5 MG/5ML PO SOLN: 5.0000 mg | Freq: Once | ORAL | Status: DC | PRN

## 2023-08-14 MED ORDER — ACETAMINOPHEN 10 MG/ML IV SOLN
INTRAVENOUS | Status: AC
  Filled 2023-08-14: qty 100

## 2023-08-14 MED ORDER — SUCCINYLCHOLINE CHLORIDE 200 MG/10ML IV SOSY
PREFILLED_SYRINGE | INTRAVENOUS | Status: DC | PRN
  Administered 2023-08-14: 120 mg via INTRAVENOUS

## 2023-08-14 MED ORDER — 0.9 % SODIUM CHLORIDE (POUR BTL) OPTIME
TOPICAL | Status: DC | PRN
Start: 2023-08-14 — End: 2023-08-14
  Administered 2023-08-14: 500 mL

## 2023-08-14 MED ORDER — MIDAZOLAM HCL 2 MG/2ML IJ SOLN
INTRAMUSCULAR | Status: AC
  Filled 2023-08-14: qty 2

## 2023-08-14 MED ORDER — SURGIFLO WITH THROMBIN (HEMOSTATIC MATRIX KIT) OPTIME
TOPICAL | Status: DC | PRN
  Administered 2023-08-14: 1 via TOPICAL

## 2023-08-14 MED ORDER — ENOXAPARIN SODIUM 40 MG/0.4ML IJ SOSY
40.0000 mg | PREFILLED_SYRINGE | INTRAMUSCULAR | Status: DC
  Administered 2023-08-15 – 2023-08-16 (×2): 40 mg via SUBCUTANEOUS
  Filled 2023-08-14 (×2): qty 0.4

## 2023-08-14 MED ORDER — KETOROLAC TROMETHAMINE 15 MG/ML IJ SOLN
15.0000 mg | Freq: Four times a day (QID) | INTRAMUSCULAR | Status: AC
  Administered 2023-08-14 – 2023-08-15 (×3): 15 mg via INTRAVENOUS
  Filled 2023-08-14 (×3): qty 1

## 2023-08-14 MED ORDER — ACETAMINOPHEN 500 MG PO TABS
1000.0000 mg | ORAL_TABLET | Freq: Four times a day (QID) | ORAL | Status: DC
  Administered 2023-08-14 – 2023-08-16 (×6): 1000 mg via ORAL
  Filled 2023-08-14 (×9): qty 2

## 2023-08-14 MED ORDER — ACETAMINOPHEN 325 MG PO TABS: 650.0000 mg | ORAL_TABLET | ORAL | Status: DC | PRN

## 2023-08-14 MED ORDER — FENTANYL CITRATE (PF) 100 MCG/2ML IJ SOLN
INTRAMUSCULAR | Status: AC
  Filled 2023-08-14: qty 2

## 2023-08-14 MED ORDER — MIDAZOLAM HCL 5 MG/5ML IJ SOLN
INTRAMUSCULAR | Status: DC | PRN
  Administered 2023-08-14: 2 mg via INTRAVENOUS

## 2023-08-14 MED ORDER — MAGNESIUM CITRATE PO SOLN: 1.0000 | Freq: Once | ORAL | Status: DC | PRN

## 2023-08-14 MED ORDER — OXYCODONE HCL 5 MG PO TABS
10.0000 mg | ORAL_TABLET | ORAL | Status: DC | PRN
  Administered 2023-08-14 – 2023-08-16 (×10): 10 mg via ORAL
  Filled 2023-08-14 (×10): qty 2

## 2023-08-14 MED ORDER — SODIUM CHLORIDE 0.9 % IV SOLN
250.0000 mL | INTRAVENOUS | Status: AC
  Administered 2023-08-14: 250 mL via INTRAVENOUS

## 2023-08-14 MED ORDER — FENTANYL CITRATE (PF) 100 MCG/2ML IJ SOLN
INTRAMUSCULAR | Status: DC | PRN
  Administered 2023-08-14: 100 ug via INTRAVENOUS

## 2023-08-14 MED ORDER — HYDROMORPHONE HCL 1 MG/ML IJ SOLN
0.5000 mg | INTRAMUSCULAR | Status: DC | PRN
  Administered 2023-08-14: 0.5 mg via INTRAVENOUS
  Filled 2023-08-14 (×2): qty 0.5

## 2023-08-14 MED ORDER — DOCUSATE SODIUM 100 MG PO CAPS
100.0000 mg | ORAL_CAPSULE | Freq: Two times a day (BID) | ORAL | Status: DC
  Administered 2023-08-14 – 2023-08-15 (×2): 100 mg via ORAL
  Filled 2023-08-14 (×4): qty 1

## 2023-08-14 MED ORDER — OXYCODONE HCL 5 MG PO TABS: 5.0000 mg | ORAL_TABLET | Freq: Once | ORAL | Status: DC | PRN

## 2023-08-14 MED ORDER — HYDROMORPHONE HCL 1 MG/ML IJ SOLN: 0.2500 mg | INTRAMUSCULAR | Status: DC | PRN

## 2023-08-14 MED ORDER — MORPHINE SULFATE (PF) 2 MG/ML IV SOLN
2.0000 mg | INTRAVENOUS | Status: DC | PRN
  Administered 2023-08-15 – 2023-08-16 (×2): 2 mg via INTRAVENOUS
  Filled 2023-08-14 (×2): qty 1

## 2023-08-14 MED ORDER — KETAMINE HCL 10 MG/ML IJ SOLN
INTRAMUSCULAR | Status: DC | PRN
  Administered 2023-08-14: 30 mg via INTRAVENOUS
  Administered 2023-08-14: 20 mg via INTRAVENOUS

## 2023-08-14 MED ORDER — PROPOFOL 500 MG/50ML IV EMUL
INTRAVENOUS | Status: DC | PRN
  Administered 2023-08-14: 150 ug/kg/min via INTRAVENOUS

## 2023-08-14 MED ORDER — KETOROLAC TROMETHAMINE 30 MG/ML IJ SOLN
INTRAMUSCULAR | Status: AC
  Filled 2023-08-14: qty 1

## 2023-08-14 MED ORDER — SENNA 8.6 MG PO TABS
1.0000 | ORAL_TABLET | Freq: Two times a day (BID) | ORAL | Status: DC
  Administered 2023-08-14 – 2023-08-15 (×3): 8.6 mg via ORAL
  Filled 2023-08-14 (×5): qty 1

## 2023-08-14 SURGICAL SUPPLY — 41 items
BASIN KIT SINGLE STR (MISCELLANEOUS) ×1 IMPLANT
BIT DRILL ACP 13 (DRILL) IMPLANT
BRUSH SCRUB EZ 4% CHG (MISCELLANEOUS) ×1 IMPLANT
BUR NEURO DRILL SOFT 3.0X3.8M (BURR) ×1 IMPLANT
DERMABOND ADVANCED .7 DNX12 (GAUZE/BANDAGES/DRESSINGS) ×1 IMPLANT
DRAIN CHANNEL JP 10F RND 20C F (MISCELLANEOUS) IMPLANT
DRAPE C-ARM XRAY 36X54 (DRAPES) ×2 IMPLANT
DRAPE LAPAROTOMY 77X122 PED (DRAPES) ×1 IMPLANT
DRAPE MICROSCOPE SPINE 48X150 (DRAPES) ×1 IMPLANT
DRSG TEGADERM 2-3/8X2-3/4 SM (GAUZE/BANDAGES/DRESSINGS) IMPLANT
ELECTRODE REM PT RTRN 9FT ADLT (ELECTROSURGICAL) ×1 IMPLANT
EVACUATOR SILICONE 100CC (DRAIN) IMPLANT
FEE INTRAOP CADWELL SUPPLY NCS (MISCELLANEOUS) IMPLANT
FEE INTRAOP MONITOR IMPULS NCS (MISCELLANEOUS) IMPLANT
GLOVE BIOGEL PI IND STRL 7.0 (GLOVE) ×1 IMPLANT
GLOVE BIOGEL PI IND STRL 8 (GLOVE) ×1 IMPLANT
GLOVE SURG SYN 7.0 PF PI (GLOVE) ×1 IMPLANT
GLOVE SURG SYN 7.5 PF PI (GLOVE) ×1 IMPLANT
GOWN SRG XL LVL 3 NONREINFORCE (GOWNS) ×1 IMPLANT
GOWN STRL REUS W/ TWL XL LVL3 (GOWN DISPOSABLE) ×1 IMPLANT
KIT TURNOVER KIT A (KITS) ×1 IMPLANT
MANIFOLD NEPTUNE II (INSTRUMENTS) ×1 IMPLANT
NS IRRIG 500ML POUR BTL (IV SOLUTION) ×1 IMPLANT
PACK LAMINECTOMY ARMC (PACKS) ×1 IMPLANT
PAD ARMBOARD POSITIONER FOAM (MISCELLANEOUS) ×2 IMPLANT
PIN ACP TEMP FIXATION (EXFIX) IMPLANT
PIN CASPAR 14 (PIN) ×1 IMPLANT
PLATE ACP 1.9X56 3LVL (Screw) IMPLANT
SCREW ACP 3.5X17 (Screw) IMPLANT
SPACER CERVICAL FRGE 12X14X5 (Spacer) IMPLANT
SPACER CERVICAL FRGE 12X14X6-7 (Spacer) IMPLANT
SPONGE KITTNER 5P (MISCELLANEOUS) ×1 IMPLANT
SURGIFLO W/THROMBIN 8M KIT (HEMOSTASIS) ×1 IMPLANT
SUT STRATA 3-0 30 PS-1 (SUTURE) ×1 IMPLANT
SUT VIC AB 3-0 SH 8-18 (SUTURE) ×1 IMPLANT
SUT VICRYL 3-0 CR8 SH (SUTURE) IMPLANT
SUTURE EHLN 3-0 FS-10 30 BLK (SUTURE) IMPLANT
SYR 20ML LL LF (SYRINGE) ×1 IMPLANT
TAPE CLOTH 3X10 WHT NS LF (GAUZE/BANDAGES/DRESSINGS) ×2 IMPLANT
TRAP FLUID SMOKE EVACUATOR (MISCELLANEOUS) ×1 IMPLANT
TRAY FOLEY SLVR 16FR LF STAT (SET/KITS/TRAYS/PACK) IMPLANT

## 2023-08-14 NOTE — Progress Notes (Signed)
  PROGRESS NOTE    Neil Parks  FMW:[id number] DOB: 1965/05/29 DOA: 08/12/2023 PCP: Patient, No Pcp Per  140A/140A-BB  LOS: 1 day   Brief hospital course:   Assessment & Plan: Neil Parks is a 58 y.o.  male with medical history significant for spinal radiculopathy who presented with sudden lost of control of his left arm and hand.     LUE weakness Severe cervical radiculopathy Cervical foraminal stenosis --s/p IV decadron  --C5-T1 Anterior Cervical Discectomy and Fusion today --drain management --pain management  Cocaine and cannabinoid use --both pos in UDS   DVT prophylaxis: Lovenox  SQ Code Status: Full code  Family Communication: mother updated at bedside today Level of care: Med-Surg Dispo:   The patient is from: home Anticipated d/c is to: home Anticipated d/c date is: 1-2 days   Subjective and Interval History:  Pt underwent C5-T1 Anterior Cervical Discectomy and Fusion today.  Tolerated it well.   Objective: Vitals:   08/14/23 1200 08/14/23 1215 08/14/23 1241 08/14/23 1554  BP: 109/70 107/68 105/70 127/82  Pulse:  81 71 61  Resp:  19 17 16   Temp: (!) 97.5 F (36.4 C) 97.7 F (36.5 C) 98.3 F (36.8 C) (!) 97.5 F (36.4 C)  TempSrc:      SpO2:  99% 97% 100%  Weight:      Height:        Intake/Output Summary (Last 24 hours) at 08/14/2023 1831 Last data filed at 08/14/2023 1221 Gross per 24 hour  Intake 240 ml  Output 905 ml  Net -665 ml   Filed Weights   08/12/23 2111 08/14/23 0707  Weight: 95.3 kg 88.5 kg    Examination:   Constitutional: NAD, sleepy after surgery HEENT: conjunctivae and lids normal, EOMI CV: No cyanosis.   RESP: normal respiratory effort, on RA Surgical drain present, out of anterior neck/chest   Data Reviewed: I have personally reviewed labs and imaging studies  Time spent: 35 minutes  Neil Haber, MD [facility] If 7PM-7AM, please contact night-coverage 08/14/2023, 6:31 PM

## 2023-08-14 NOTE — Transfer of Care (Signed)
 Immediate Anesthesia Transfer of Care Note  Patient: Neil Parks  Procedure(s) Performed: C5-T1 Anterior Cervical Discectomy and Fusion (Spine Cervical)  Patient Location: PACU  Anesthesia Type:General  Level of Consciousness: drowsy  Airway & Oxygen Therapy: Patient Spontanous Breathing and Patient connected to face mask oxygen  Post-op Assessment: Report given to RN  Post vital signs: stable  Last Vitals:  Vitals Value Taken Time  BP 135/89 08/14/23 11:28  Temp    Pulse 92 08/14/23 11:31  Resp 11 08/14/23 11:31  SpO2 100 % 08/14/23 11:31  Vitals shown include unfiled device data.  Last Pain:  Vitals:   08/14/23 0707  TempSrc: Temporal  PainSc: 0-No pain         Complications: No notable events documented.

## 2023-08-14 NOTE — Progress Notes (Signed)
 Edsel Goods PA examined patient while in pacu, patient able to bend lower ext, push.  RUE, able to lift off bed, hand grasps weak but baseline per Edsel Goods. LUE pt is able to lift ext, hand grasps 1, able to turn wrist, also at baseline per [facility].

## 2023-08-14 NOTE — Anesthesia Procedure Notes (Signed)
 Procedure Name: Intubation Date/Time: 08/14/2023 7:38 AM  Performed by: Rosine Shona Jansky, CRNAPre-anesthesia Checklist: Patient identified, Emergency Drugs available, Suction available and Patient being monitored Patient Re-evaluated:Patient Re-evaluated prior to induction Oxygen Delivery Method: Circle system utilized Preoxygenation: Pre-oxygenation with 100% oxygen Induction Type: IV induction Ventilation: Mask ventilation without difficulty Laryngoscope Size: McGrath and 4 Grade View: Grade I Tube type: Oral Tube size: 7.5 mm Number of attempts: 1 Airway Equipment and Method: Stylet and Oral airway Placement Confirmation: ETT inserted through vocal cords under direct vision, positive ETCO2 and breath sounds checked- equal and bilateral Secured at: 22 cm Tube secured with: Tape Dental Injury: Teeth and Oropharynx as per pre-operative assessment

## 2023-08-14 NOTE — Anesthesia Postprocedure Evaluation (Signed)
 Anesthesia Post Note  Patient: Neil Parks  Procedure(s) Performed: C5-T1 Anterior Cervical Discectomy and Fusion (Spine Cervical)  Patient location during evaluation: PACU Anesthesia Type: General Level of consciousness: awake and alert Pain management: pain level controlled Vital Signs Assessment: post-procedure vital signs reviewed and stable Respiratory status: spontaneous breathing, nonlabored ventilation, respiratory function stable and patient connected to nasal cannula oxygen Cardiovascular status: blood pressure returned to baseline and stable Postop Assessment: no apparent nausea or vomiting Anesthetic complications: no   No notable events documented.   Last Vitals:  Vitals:   08/14/23 1200 08/14/23 1215  BP: 109/70 107/68  Pulse:  81  Resp:  19  Temp: (!) 36.4 C 36.5 C  SpO2:  99%    Last Pain:  Vitals:   08/14/23 0707  TempSrc: Temporal  PainSc: 0-No pain    LLE Motor Response: Purposeful movement (08/14/23 1215)   RLE Motor Response: Purposeful movement (08/14/23 1215)        [name]

## 2023-08-14 NOTE — Plan of Care (Signed)
  Problem: Education: Goal: Knowledge of General Education information will improve Description: Including pain rating scale, medication(s)/side effects and non-pharmacologic comfort measures Outcome: Progressing   Problem: Clinical Measurements: Goal: Diagnostic test results will improve Outcome: Progressing   Problem: Activity: Goal: Risk for activity intolerance will decrease Outcome: Progressing   Problem: Coping: Goal: Level of anxiety will decrease Outcome: Progressing   Problem: Pain Managment: Goal: General experience of comfort will improve and/or be controlled Outcome: Progressing   Problem: Safety: Goal: Ability to remain free from injury will improve Outcome: Progressing

## 2023-08-14 NOTE — Anesthesia Preprocedure Evaluation (Addendum)
 Anesthesia Evaluation  Patient identified by MRN, date of birth, ID band Patient awake    Reviewed: Allergy & Precautions, NPO status , Patient's Chart, lab work & pertinent test results  History of Anesthesia Complications Negative for: history of anesthetic complications  Airway Mallampati: II  TM Distance: >3 FB Neck ROM: full    Dental  (+) Poor Dentition, Missing   Pulmonary Current Smoker and Patient abstained from smoking.   Pulmonary exam normal        Cardiovascular negative cardio ROS Normal cardiovascular exam     Neuro/Psych  Neuromuscular disease  negative psych ROS   GI/Hepatic negative GI ROS,,,(+)     substance abuse  cocaine use  Endo/Other  negative endocrine ROS    Renal/GU      Musculoskeletal   Abdominal   Peds  Hematology negative hematology ROS (+)   Anesthesia Other Findings Past Medical History: No date: Kidney stones  Past Surgical History: No date: FRACTURE SURGERY No date: HERNIA REPAIR  BMI    Body Mass Index: 28.48 kg/m      Reproductive/Obstetrics negative OB ROS                              Anesthesia Physical Anesthesia Plan  ASA: 3  Anesthesia Plan: General ETT   Post-op Pain Management: Toradol  IV (intra-op)*, Ofirmev  IV (intra-op)*, Dilaudid  IV, Ketamine  IV* and Precedex    Induction: Intravenous  PONV Risk Score and Plan: 2 and Ondansetron , Dexamethasone , Midazolam  and Treatment may vary due to age or medical condition  Airway Management Planned: Oral ETT  Additional Equipment:   Intra-op Plan:   Post-operative Plan: Extubation in OR  Informed Consent: I have reviewed the patients History and Physical, chart, labs and discussed the procedure including the risks, benefits and alternatives for the proposed anesthesia with the patient or authorized representative who has indicated his/her understanding and acceptance.      Dental Advisory Given  Plan Discussed with: Anesthesiologist, CRNA and Surgeon  Anesthesia Plan Comments: (Patient consented for risks of anesthesia including but not limited to:  - adverse reactions to medications - damage to eyes, teeth, lips or other oral mucosa - nerve damage due to positioning  - sore throat or hoarseness - Damage to heart, brain, nerves, lungs, other parts of body or loss of life  Patient voiced understanding and assent.)         Anesthesia Quick Evaluation

## 2023-08-14 NOTE — Interval H&P Note (Signed)
 History and Physical Interval Note:  08/14/2023 7:00 AM  Neil Parks  has presented today for surgery, with the diagnosis of Severe Cervical Radiculopathy.  The various methods of treatment have been discussed with the patient and family. After consideration of risks, benefits and other options for treatment, the patient has consented to  Procedure(s): C5-T1 Anterior Cervical Discectomy and Fusion, Possible C4-5 (N/A) as a surgical intervention.  The patient's history has been reviewed, patient examined, no change in status, stable for surgery.  I have reviewed the patient's chart and labs.  Questions were answered to the patient's satisfaction.    Patient continues to have significant deficits in the left upper extremity predominantly.  His case was discussed with neurology we agreed that he was symptomatic from cervical radiculopathy, given his severe deficits and severe cervical stenosis especially in the bilateral foramina left worse than right we will plan for a C5-T1 anterior cervical discectomy and fusion.  We discussed this with the patient, he would like to go forward with that given his severe weakness.  We discussed the risk and benefits of the procedure including but not limited to CSF leak, spinal cord injury, failure to relieve his symptoms, need for further surgery, hardware failure, wound infection, hematoma.  We also discussed that this was potentially ischemia to the nerves given his history of cocaine use and that he may not have a significant recovery postoperatively however given ongoing severe stenosis there is a strong indication for decompression  Penne LELON Sharps

## 2023-08-14 NOTE — Op Note (Signed)
 Indications: 58 year old man with an acute onset of left upper extremity weakness wrist drop and hand weakness in the setting of severe cervical foraminal stenosis.  He was evaluated by myself and independently by a neurologist to evaluate most likely etiology of his presentation.  Given the severe stenosis and physical exam findings along with his history we agree that he was experiencing asymptomatic cervical radiculopathy and given the severity of his deficits should be taken for surgery for decompression  Findings: At the C5-6 level, there is a soft disc extrusion lateral in the region of the foramina.  At C6-7 and C7-T1 and there was calcified disc material and posterior longitudinal ligament as well as severe foraminal stenosis that was well decompressed at the end of the procedure    Preoperative Diagnosis:  Severe Cervical Radiculopathy Cervical foraminal stenosis Left upper extremity weakness  Postoperative Diagnosis:  Severe Cervical Radiculopathy Cervical foraminal stenosis Left upper extremity weakness  EBL: 50 ml IVF: See anesthesia report Drains: Submuscular drain Disposition: Extubated and Stable to PACU Complications: none  No foley catheter was placed.   Preoperative Note:   Risks of surgery discussed include: infection, bleeding, stroke, coma, death, paralysis, CSF leak, nerve/spinal cord injury, numbness, tingling, weakness, complex regional pain syndrome, recurrent stenosis and/or disc herniation, vascular injury, development of instability, neck/back pain, need for further surgery, persistent symptoms, development of deformity, and the risks of anesthesia. The patient understood these risks and agreed to proceed.  Procedure:  1) Anterior cervical diskectomy and fusion at C5-T1 2) Anterior cervical instrumentation at C5-T1 3) Structural allograft consisting of corticocancellous allograft   Procedure: After obtaining informed consent, the patient taken to the  operating room, placed in supine position, general anesthesia induced.  The patient had a small shoulder roll placed behind their shoulders.  The patient received preop antibiotics and IV Decadron .  The patient had a neck incision outlined, was prepped and draped in usual sterile fashion. The incision was injected with local anesthetic.   An incision was opened, dissection taken down medial to the carotid artery and jugular vein, lateral to the trachea and esophagus.  The prevertebral fascia identified and a localizing x-ray demonstrated the correct level.  The longus colli were dissected laterally, and self-retaining retractors placed to open the operative field. The microscope was then brought into the field.  With this complete, distractor pins were placed in the vertebral bodies of C5 and C 6.  The distractor was placed, and the annulus at C 5/6 was opened using a bovie.  Curettes and pituitary rongeurs used to remove the majority of disk, then the drill was used to remove the posterior osteophyte and begin the foraminotomies. The nerve hook was used to elevate the posterior longitudinal ligament, which was then removed with Kerrison rongeurs. The microblunt nerve hook could be passed out the foramen bilaterally.   Meticulous hemostasis obtained.  Structural allograft was tapped behind the anterior lip of the vertebral body at C 5/6 (6 mm).    With 5 6 level complete, we turned our attention to the 6 7 level.  distractor pins were placed in the vertebral bodies of  C 7.  The distractor was placed, and the annulus at C 6/7 was opened using a bovie.  Curettes and pituitary rongeurs used to remove the majority of disk, then the drill was used to remove the posterior osteophyte and begin the foraminotomies. The nerve hook was used to elevate the posterior longitudinal ligament, which was then removed with Kerrison rongeurs.  The microblunt nerve hook could be passed out the foramen bilaterally.   Meticulous  hemostasis obtained.  Structural allograft was tapped behind the anterior lip of the vertebral body at C 6/7 (6 mm).  With his level complete we then examined for a clear approach to the C7-T1 disc space, given the length of his vertebral bodies as well as crossing neurovascular structures we are unable to safely reach it through the current incision.  We then opened an incision more caudally to get a more direct view.  Distractor pins were then placed into the bodies C7-T1.  The distractor was placed, and the annulus at C7-T1 was opened using a bovie.  Curettes and pituitary rongeurs used to remove the majority of disk, then the drill was used to remove the posterior osteophyte and begin the foraminotomies. The nerve hook was used to elevate the posterior longitudinal ligament, which was then removed with Kerrison rongeurs. The microblunt nerve hook could be passed out the foramen bilaterally.   Meticulous hemostasis obtained.  Structural allograft was tapped behind the anterior lip of the vertebral body at C7-T1 (5 mm).      The caspar distractor was removed, and bone wax used for hemostasis. A separate, 56 mm plate was chosen.  Two screws placed in each vertebral body, respectively making sure the screws were behind the locking mechanism.  Final AP and lateral radiographs were taken.   With everything in good position, the wound was irrigated copiously and meticulous hemostasis obtained.  A drain was placed wound was closed in 2 layers using interrupted inverted 3-0 Vicryl sutures.  The wound was dressed with dermabond, the head of bed at 30 degrees, taken to recovery room in stable condition.  No new postop neurological deficits were identified.  Sponge and pattie counts were correct at the end of the procedure.    I performed the procedure with the assistance of Edsel Goods, they aided in the exposure, retraction of critical structures, visualization of critical structures, stabilization during  instrumentation, and closure.  Implants: Implant Name Type Inv. Item Serial No. Manufacturer Lot No. LRB No. Used Action  SPACER CERVICAL FRGE [id number] - [id number] Spacer SPACER CERVICAL FRGE [id number] [facility] MEDICAL  N/A 1 Implanted  SPACER CERVICAL FRGE 12X14X6-7 - [id number] Spacer SPACER CERVICAL FRGE 12X14X6-7 [id number] [employer]  N/A 1 Implanted  SPACER CERVICAL FRGE 12X14X5 - [id number] Spacer SPACER CERVICAL FRGE 12X14X5 [id number] [employer]  N/A 1 Implanted  PLATE ACP 8.0K43 3LVL - [id number] Screw PLATE ACP 8.0K43 3LVL  [employer]  N/A 1 Implanted  SCREW ACP 3.5X17 - [id number] Screw SCREW ACP 3.5X17  [employer]  N/A 8 Implanted      Penne MICAEL Sharps, MD/MSCR

## 2023-08-15 ENCOUNTER — Encounter: Payer: Self-pay | Admitting: Neurosurgery

## 2023-08-15 LAB — BASIC METABOLIC PANEL WITH GFR
Anion gap: 6 (ref 5–15)
BUN: 17 mg/dL (ref 6–20)
CO2: 24 mmol/L (ref 22–32)
Calcium: 8.4 mg/dL — ABNORMAL LOW (ref 8.9–10.3)
Chloride: 106 mmol/L (ref 98–111)
Creatinine, Ser: 0.51 mg/dL — ABNORMAL LOW (ref 0.61–1.24)
GFR, Estimated: >60 mL/min (ref >60)
Glucose, Bld: 111 mg/dL — ABNORMAL HIGH (ref 70–99)
Potassium: 3.7 mmol/L (ref 3.5–5.1)
Sodium: 136 mmol/L (ref 135–145)

## 2023-08-15 LAB — CBC
HCT: 40.1 % (ref 39.0–52.0)
Hemoglobin: 13.4 g/dL (ref 13.0–17.0)
MCH: 29.6 pg (ref 26.0–34.0)
MCHC: 33.4 g/dL (ref 30.0–36.0)
MCV: 88.5 fL (ref 80.0–100.0)
Platelets: 190 K/uL (ref 150–400)
RBC: 4.53 MIL/uL (ref 4.22–5.81)
RDW: 14.8 % (ref 11.5–15.5)
WBC: 15.3 K/uL — ABNORMAL HIGH (ref 4.0–10.5)
nRBC: 0 % (ref 0.0–0.2)

## 2023-08-15 LAB — MAGNESIUM: Magnesium: 1.9 mg/dL (ref 1.7–2.4)

## 2023-08-15 NOTE — Progress Notes (Signed)
   Neurosurgery Progress Note  History: Neil Parks is s/p C5-T1 ACDF for severe cervical radiculopathy  POD1: Doing well this morning with improvement of his pre-op arm pain.  Physical Exam: Vitals:   08/14/23 2326 08/15/23 0234  BP: 125/83 115/73  Pulse: 63 (!) 53  Resp: 18 18  Temp: 98 F (36.7 C) 98.2 F (36.8 C)  SpO2: 99% 100%    AA Ox3 CNI  Strength: Side Biceps Triceps Deltoid Interossei Grip Wrist Ext. Wrist Flex.  R 5 5 5 5 5 5 5   L 5 4+ 5 3 3 2 3    Incisions c/d/i  JP output 5 since surgery  Data:  Other tests/results: see results review  Assessment/Plan:  Neil Parks is a 58 y.o presenting with new weakness and severe cervical foraminal stenosis s/p C5-T1 ACDF   - mobilize - pain control - DVT prophylaxis - PTOT - remainder of care per primary team  Edsel Goods PA-C Department of Neurosurgery

## 2023-08-15 NOTE — Evaluation (Signed)
 Physical Therapy Evaluation Patient Details Name: Neil Parks MRN: [id number] DOB: October 06, 1965 Today's Date: 08/15/2023  History of Present Illness  Pt is a 58 y.o presenting with new weakness and severe cervical foraminal stenosis s/p C5-T1 ACDF on 7/22  Clinical Impression  Pt admitted with above diagnosis. Pt currently with functional limitations due to the deficits listed below (see PT Problem List). Pt received upright in bed with PA removing JP drain. Reports PTA being indep with gait and ADL's/IADL's. Pt from out of town and unsure of d/c location but has family and friends that he plans to ask.   To date, reviewd education as listed below with BLT and log roll prior to mobility. Completes all mobility independently ambulating ~300' with safe completion of stairs with rail use. Completed DGI minus head turns due to BLT precautions. Anticipate if able to score head turns pt would have scored > 19/30 which is indicative of low falls risk with community distances. Did discuss OP OT f/u for hand therapy with hand specialist with OT and medical team on board but no acute PT needs identified. Pt with no questions, verbalizes understanding of education. Pt left in bed with all needs in reach. PT to sign off in house.      If plan is discharge home, recommend the following: Assist for transportation   Can travel by private vehicle        Equipment Recommendations None recommended by PT  Recommendations for Other Services       Functional Status Assessment Patient has not had a recent decline in their functional status     Precautions / Restrictions Precautions Precautions: Cervical Precaution Booklet Issued: Yes (comment) Recall of Precautions/Restrictions: Impaired Precaution/Restrictions Comments: no brace needed per order Restrictions Weight Bearing Restrictions Per Provider Order: No      Mobility  Bed Mobility Overal bed mobility: Independent             General  bed mobility comments: able to perform log roll correctly Patient Response: Cooperative  Transfers Overall transfer level: Independent                      Ambulation/Gait Ambulation/Gait assistance: Independent Gait Distance (Feet): 300 Feet   Gait Pattern/deviations: Step-through pattern, WFL(Within Functional Limits)       General Gait Details: slowed cadence but good stability with gait  Stairs Stairs: Yes Stairs assistance: Modified independent (Device/Increase time) Stair Management: Two rails, Alternating pattern, Forwards Number of Stairs: 4 General stair comments: asc/desc 4x for safe practice in case pt d/c's to a residence with steps. Good and safe completion.  Wheelchair Mobility     Tilt Bed Tilt Bed Patient Response: Cooperative  Modified Rankin (Stroke Patients Only)       Balance                                 Standardized Balance Assessment Standardized Balance Assessment : Dynamic Gait Index   Dynamic Gait Index Level Surface: Normal Change in Gait Speed: Mild Impairment Gait with Horizontal Head Turns: Severe Impairment (not completed due to BLT precautions) Gait with Vertical Head Turns: Severe Impairment (not completed due to BLT precautions) Gait and Pivot Turn: Normal Step Over Obstacle: Normal Step Around Obstacles: Normal Steps: Mild Impairment Total Score: 16       Pertinent Vitals/Pain Pain Assessment Pain Assessment: No/denies pain    Home Living Family/patient expects to  be discharged to:: Unsure                   Additional Comments: pt unsure of discharge plan (from [place], plans to stay with family or friends but unsure exactly where)    Prior Function Prior Level of Function : Independent/Modified Independent;Working/employed;Driving             Mobility Comments: no AD, 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 ADLs Comments: independent     Extremity/Trunk Assessment    Upper Extremity Assessment Upper Extremity Assessment: Defer to OT evaluation RUE Deficits / Details: good grip strength, WFL ROM and sensation LUE Deficits / Details: impaired digit opposition, poor grip strength in L hand LUE Coordination: decreased fine motor;decreased gross motor    Lower Extremity Assessment Lower Extremity Assessment: Overall WFL for tasks assessed    Cervical / Trunk Assessment Cervical / Trunk Assessment: Neck Surgery  Communication   Communication Communication: No apparent difficulties    Cognition Arousal: Alert Behavior During Therapy: WFL for tasks assessed/performed   PT - Cognitive impairments: No apparent impairments                         Following commands: Intact       Cueing Cueing Techniques: Verbal cues     General Comments General comments (skin integrity, edema, etc.): anticipate pt would have scored > 19 if pt allowed head turns. This would be indicative of low falls risk with household and community distances.    Exercises Other Exercises Other Exercises: reviewed BLT, stair training, log rolling technique. Encouraged OP OT for hand therapy due to L hand/wrist weakness.   Assessment/Plan    PT Assessment Patient does not need any further PT services  PT Problem List         PT Treatment Interventions      PT Goals (Current goals can be found in the Care Plan section)  Acute Rehab PT Goals Patient Stated Goal: to return to work PT Goal Formulation: With patient Time For Goal Achievement: 08/29/23 Potential to Achieve Goals: Good    Frequency       Co-evaluation               AM-PAC PT 6 Clicks Mobility  Outcome Measure Help needed turning from your back to your side while in a flat bed without using bedrails?: None Help needed moving from lying on your back to sitting on the side of a flat bed without using bedrails?: None Help needed moving to and from a bed to a chair (including a  wheelchair)?: None Help needed standing up from a chair using your arms (e.g., wheelchair or bedside chair)?: None Help needed to walk in hospital room?: None Help needed climbing 3-5 steps with a railing? : A Little 6 Click Score: 23    End of Session   Activity Tolerance: Patient tolerated treatment well Patient left: in bed;with call bell/phone within reach Nurse Communication: Mobility status PT Visit Diagnosis: Muscle weakness (generalized) (M62.81)    Time: 8993-8981 PT Time Calculation (min) (ACUTE ONLY): 12 min   Charges:   PT Evaluation $PT Eval Low Complexity: 1 Low   PT General Charges $$ ACUTE PT VISIT: 1 Visit        Dorina HERO. Fairly IV, PT, DPT Physical Therapist- [place]  [facility] 08/15/2023, 11:06 AM

## 2023-08-15 NOTE — Evaluation (Signed)
 Occupational Therapy Evaluation Patient Details Name: Neil Parks MRN: [id number] DOB: Aug 27, 1965 Today's Date: 08/15/2023   History of Present Illness   Pt is a 58 y.o presenting with new LUE weakness and severe cervical foraminal stenosis s/p C5-T1 ACDF on 7/22     Clinical Impressions Pt seen for OT evaluation. Prior to above procedure, pt was independent in ADLs/IADLs/mobility/driving. Pt shares that he lives in [place] and is uncertain of discharge location at this time. He plans to discuss with family and friends this afternoon. Pt is R hand dominant, denies numbness or tingling in BUE; presents with L UE deficits in strength, ROM, FMC/GMC coordination and bimanual task performance. Reviewed cervical precautions with pt and provided pt with handout to support recall and carryover of learned precautions/techniques for bed mobility, functional transfers, and self care skills. Pt able to return demonstrate log roll technique for bed mobility, functional transfers and mobility with supervision, ambulates to bathroom and demos toilet transfer without use of AD or grab bars with neutral cervical alignment. Pt would benefit from skilled OT services to address noted impairments and functional limitations (see below for any additional details) in order to maximize safety and independence while minimizing falls risk and caregiver burden. Recommending outpatient CHT OT for continued recovery of functional use of BUE given deficits listed above and below in flowsheet.    If plan is discharge home, recommend the following:   Assist for transportation;A little help with bathing/dressing/bathroom     Functional Status Assessment   Patient has had a recent decline in their functional status and demonstrates the ability to make significant improvements in function in a reasonable and predictable amount of time.     Equipment Recommendations   None recommended by OT       Precautions/Restrictions   Precautions Precautions: Cervical Precaution Booklet Issued: Yes (comment) Recall of Precautions/Restrictions: Impaired Precaution/Restrictions Comments: no brace needed per order Restrictions Weight Bearing Restrictions Per Provider Order: No     Mobility Bed Mobility Overal bed mobility: Independent             General bed mobility comments: able to perform log roll correctly    Transfers Overall transfer level: Independent Equipment used: None                      Balance Overall balance assessment: Mild deficits observed, not formally tested                                         ADL either performed or assessed with clinical judgement   ADL Overall ADL's : Needs assistance/impaired                     Lower Body Dressing: Supervision/safety;Cueing for compensatory techniques;Cueing for sequencing;Cueing for safety Lower Body Dressing Details (indicate cue type and reason): performs seated figure four. max vcs to adhere to cervical precautions Toilet Transfer: Supervision/safety;Ambulation Toilet Transfer Details (indicate cue type and reason): easily performs transfer without use of hands Toileting- Clothing Manipulation and Hygiene: Supervision/safety;Sit to/from stand       Functional mobility during ADLs: Supervision/safety General ADL Comments: Pt has been completing ADLs independently in room.     Vision Baseline Vision/History: 1 Wears glasses Ability to See in Adequate Light: 0 Adequate Vision Assessment?: Wears glasses for reading  Pertinent Vitals/Pain Pain Assessment Pain Assessment: 0-10 Pain Score: 2  Pain Location: cervical area Pain Descriptors / Indicators: Grimacing, Guarding Pain Intervention(s): Limited activity within patient's tolerance, Repositioned, Premedicated before session     Extremity/Trunk Assessment Upper Extremity Assessment Upper  Extremity Assessment: Right hand dominant;RUE deficits/detail RUE Deficits / Details: good grip strength, WFL ROM and sensation, grossly 5/5 LUE Deficits / Details: impaired digit opposition, FMC/GMC,  poor grip strength in L hand, wrist extension 2/5, wrist flexion 3/5. able to perform wrist extension against gravity LUE Coordination: decreased fine motor;decreased gross motor   Lower Extremity Assessment Lower Extremity Assessment: Overall WFL for tasks assessed   Cervical / Trunk Assessment Cervical / Trunk Assessment: Neck Surgery   Communication Communication Communication: No apparent difficulties   Cognition Arousal: Alert Behavior During Therapy: WFL for tasks assessed/performed Cognition: No apparent impairments                               Following commands: Intact       Cueing  General Comments   Cueing Techniques: Verbal cues  Per secure chat with MD Claudene from neurosurgery, pt may benefit from radial type splint to prevent wrist drop. Communicated with MD regarding need for order to be placed, OT will continue to follow and assess.            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 ADLs Comments: independent    OT Problem List: Decreased strength;Decreased knowledge of precautions;Decreased range of motion;Decreased coordination;Impaired UE functional use   OT Treatment/Interventions: Self-care/ADL training;Therapeutic activities;Therapeutic exercise;Neuromuscular education;Splinting;Patient/family education      OT Goals(Current goals can be  found in the care plan section)   Acute Rehab OT Goals OT Goal Formulation: With patient Time For Goal Achievement: 08/29/23 Potential to Achieve Goals: Good   OT Frequency:  Min 2X/week       AM-PAC OT 6 Clicks Daily Activity     Outcome Measure Help from another person eating meals?: None Help from another person taking care of personal grooming?: None Help from another person toileting, which includes using toliet, bedpan, or urinal?: None Help from another person bathing (including washing, rinsing, drying)?: A Little Help from another person to put on and taking off regular upper body clothing?: A Little Help from another person to put on and taking off regular lower body clothing?: None 6 Click Score: 10   End of Session Nurse Communication: Mobility status  Activity Tolerance: Patient tolerated treatment well Patient left: in bed;with call bell/phone within reach;with bed alarm set  OT Visit Diagnosis: Unsteadiness on feet (R26.81);Muscle weakness (generalized) (M62.81);Other abnormalities of gait and mobility (R26.89)                Time: 0933-1000 OT Time Calculation (min): 27 min Charges:  OT General Charges $OT Visit: 1 Visit OT Evaluation $OT Eval Low Complexity: 1 Low Rishan Oyama L. Lexii Walsh, OTR/L  08/15/23, 2:14 PM

## 2023-08-15 NOTE — Plan of Care (Signed)

## 2023-08-15 NOTE — Discharge Instructions (Signed)

## 2023-08-15 NOTE — Progress Notes (Signed)
 PROGRESS NOTE    Neil Parks  FMW:[id number] DOB: 1965/06/09 DOA: 08/12/2023 PCP: Patient, No Pcp Per  Assessment & Plan:   Principal Problem:   Radiculopathy affecting upper extremity Active Problems:   Left hand weakness   Foraminal stenosis of cervical region  Assessment and Plan:  Severe cervical radiculopathy: w/ LUE weakness. S/p C5-T1 anterior cervical discectomy & fusion on 08/14/23 as per neuro surg.S/p IV decadron . 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 Cessation counseling already done  Leukocytosis: likely secondary to recent surg. Will continue to monitor  Hyperglycemia: no hx of DM. Possibly secondary to decadron  use. Will continue to monitor     DVT prophylaxis: lovenox  Code Status:full Family Communication:  Disposition Plan: likely d/c home tomorrow  Level of care: Med-Surg Status is: Inpatient Remains inpatient appropriate because: still w/ a lot of pain    Consultants:  Neuro surg   Procedures:   Antimicrobials:    Subjective: Pt c/o neck pain  Objective: Vitals:   08/14/23 2326 08/15/23 0234 08/15/23 0746 08/15/23 1717  BP: 125/83 115/73 (!) 119/94 (!) 138/91  Pulse: 63 (!) 53 (!) 56 (!) 57  Resp: 18 18 15 18   Temp: 98 F (36.7 C) 98.2 F (36.8 C) 97.6 F (36.4 C) 98.2 F (36.8 C)  TempSrc:   Oral Oral  SpO2: 99% 100% 98% 96%  Weight:      Height:        Intake/Output Summary (Last 24 hours) at 08/15/2023 1843 Last data filed at 08/15/2023 1300 Gross per 24 hour  Intake 960 ml  Output 15 ml  Net 945 ml   Filed Weights   08/12/23 2111 08/14/23 0707  Weight: 95.3 kg 88.5 kg    Examination:  General exam: Appears calm but uncomfortable  Respiratory system: Clear to auscultation. Respiratory effort normal. Cardiovascular system: S1 & S2+. No rubs, gallops or clicks. Gastrointestinal system: Abdomen is nondistended, soft and nontender.  Normal  bowel sounds heard. Central nervous system: Alert and oriented. Moves all extremities Psychiatry: Judgement and insight appear normal. Flat mood and affect    Data Reviewed: I have personally reviewed following labs and imaging studies  CBC: Recent Labs  Lab 08/12/23 2212 08/15/23 0445  WBC 5.4 15.3*  NEUTROABS 2.8  --   HGB 14.8 13.4  HCT 44.0 40.1  MCV 87.5 88.5  PLT 197 190   Basic Metabolic Panel: Recent Labs  Lab 08/12/23 2212 08/15/23 0445  NA 140 136  K 3.8 3.7  CL 104 106  CO2 27 24  GLUCOSE 97 111*  BUN 13 17  CREATININE 0.98 0.51*  CALCIUM 8.9 8.4*  MG  --  1.9   GFR: Estimated Creatinine Clearance: 114.5 mL/min (A) (by C-G formula based on SCr of 0.51 mg/dL (L)). Liver Function Tests: Recent Labs  Lab 08/12/23 2212  AST 90*  ALT 47*  ALKPHOS 117  BILITOT 0.7  PROT 8.1  ALBUMIN 3.6   No results for input(s): LIPASE, AMYLASE in the last 168 hours. No results for input(s): AMMONIA in the last 168 hours. Coagulation Profile: Recent Labs  Lab 08/12/23 2212  INR 1.0   Cardiac Enzymes: Recent Labs  Lab 08/13/23 0426  CKTOTAL 154   BNP (last 3 results) No results for input(s): PROBNP in the last 8760 hours. HbA1C: No results for input(s): HGBA1C in the last 72 hours. CBG: No results for input(s): GLUCAP in the last  168 hours. Lipid Profile: No results for input(s): CHOL, HDL, LDLCALC, TRIG, CHOLHDL, LDLDIRECT in the last 72 hours. Thyroid Function Tests: No results for input(s): TSH, T4TOTAL, FREET4, T3FREE, THYROIDAB in the last 72 hours. Anemia Panel: No results for input(s): VITAMINB12, FOLATE, FERRITIN, TIBC, IRON, RETICCTPCT in the last 72 hours. Sepsis Labs: No results for input(s): PROCALCITON, LATICACIDVEN in the last 168 hours.  Recent Results (from the past 240 hours)  Surgical PCR screen     Status: None   Collection Time: 08/13/23  1:50 AM   Specimen: Nasal Mucosa; Nasal  Swab  Result Value Ref Range Status   MRSA, PCR NEGATIVE NEGATIVE Final   Staphylococcus aureus NEGATIVE NEGATIVE Final    Comment: (NOTE) The Xpert SA Assay (FDA approved for NASAL specimens in patients 32 years of age and older), is one component of a comprehensive surveillance program. It is not intended to diagnose infection nor to guide or monitor treatment. Performed at [facility], [address] [place], [place] [address]          Radiology Studies: DG Cervical Spine 2-3 Views Result Date: 08/14/2023 CLINICAL DATA:  Elective surgery. EXAM: CERVICAL SPINE - 2-3 VIEW COMPARISON:  None Available. FINDINGS: Two fluoroscopic spot views of the cervical spine submitted from the operating room. Anterior fusion C5 through T1. Interbody spacers are seen at C5-C6 and C6-C7. Lower aspect of the hardware is not well visualized on the lateral view due to soft tissue attenuation. Fluoroscopy time 20 seconds. Dose 3.29 mGy. IMPRESSION: Intraoperative fluoroscopy during cervical fusion. Electronically Signed   By: Andrea Gasman M.D.   On: 08/14/2023 11:47   DG C-Arm 1-60 Min-No Report Result Date: 08/14/2023 Fluoroscopy was utilized by the requesting physician.  No radiographic interpretation.   DG C-Arm 1-60 Min-No Report Result Date: 08/14/2023 Fluoroscopy was utilized by the requesting physician.  No radiographic interpretation.   DG C-Arm 1-60 Min-No Report Result Date: 08/14/2023 Fluoroscopy was utilized by the requesting physician.  No radiographic interpretation.        Scheduled Meds:  acetaminophen   1,000 mg Oral Q6H   docusate sodium   100 mg Oral BID   enoxaparin  (LOVENOX ) injection  40 mg Subcutaneous Q24H   senna  1 tablet Oral BID   sodium chloride  flush  3 mL Intravenous Q12H   Continuous Infusions:   LOS: 2 days       Anthony CHRISTELLA Pouch, MD [facility] Pager [phone]  If 7PM-7AM, please contact  night-coverage [email or web address] 08/15/2023, 6:43 PM

## 2023-08-16 LAB — CBC
HCT: 40.5 % (ref 39.0–52.0)
Hemoglobin: 13.7 g/dL (ref 13.0–17.0)
MCH: 30 pg (ref 26.0–34.0)
MCHC: 33.8 g/dL (ref 30.0–36.0)
MCV: 88.8 fL (ref 80.0–100.0)
Platelets: 183 K/uL (ref 150–400)
RBC: 4.56 MIL/uL (ref 4.22–5.81)
RDW: 14.7 % (ref 11.5–15.5)
WBC: 7.8 K/uL (ref 4.0–10.5)
nRBC: 0 % (ref 0.0–0.2)

## 2023-08-16 LAB — BASIC METABOLIC PANEL WITH GFR
Anion gap: 7 (ref 5–15)
BUN: 18 mg/dL (ref 6–20)
CO2: 25 mmol/L (ref 22–32)
Calcium: 8.3 mg/dL — ABNORMAL LOW (ref 8.9–10.3)
Chloride: 106 mmol/L (ref 98–111)
Creatinine, Ser: 0.63 mg/dL (ref 0.61–1.24)
GFR, Estimated: >60 mL/min (ref >60)
Glucose, Bld: 102 mg/dL — ABNORMAL HIGH (ref 70–99)
Potassium: 4 mmol/L (ref 3.5–5.1)
Sodium: 138 mmol/L (ref 135–145)

## 2023-08-16 LAB — MAGNESIUM: Magnesium: 1.8 mg/dL (ref 1.7–2.4)

## 2023-08-16 MED ORDER — METHOCARBAMOL 500 MG PO TABS: 500.0000 mg | ORAL_TABLET | Freq: Four times a day (QID) | ORAL | 0 refills | Status: DC | PRN

## 2023-08-16 MED ORDER — OXYCODONE HCL 10 MG PO TABS: 10.0000 mg | ORAL_TABLET | ORAL | 0 refills | Status: DC | PRN

## 2023-08-16 NOTE — Progress Notes (Signed)
 Occupational Therapy Treatment Patient Details Name: Neil Parks MRN: [id number] DOB: [birth year] Today's Date: 08/16/2023   History of present illness Pt is a 58 y.o presenting with new LUE weakness and severe cervical foraminal stenosis s/p C5-T1 ACDF on 7/22   OT comments  Mr Deery seen for OT treatment on this date. Upon arrival to room pt in bed, agreeable to tx. Pt reports improvement in LUE function, demonstrating improved grip strength. Pt requires SUPERVISION for standing grooming at sink, reporting difficulty w/ squeezing toothpaste, but able to grip cup used for oral hygiene. Pt educated on HEP for hand strengthening and dexterity, handout provided along with pink therapy sponge. Pt making good progress toward goals, will continue to follow POC. Discharge recommendation remains appropriate.        If plan is discharge home, recommend the following:  Assist for transportation;A little help with bathing/dressing/bathroom   Equipment Recommendations  None recommended by OT    Recommendations for Other Services      Precautions / Restrictions Precautions Precautions: Cervical Recall of Precautions/Restrictions: Intact Restrictions Weight Bearing Restrictions Per Provider Order: No       Mobility Bed Mobility Overal bed mobility: Independent                  Transfers Overall transfer level: Independent                       Balance Overall balance assessment: No apparent balance deficits (not formally assessed)                                         ADL either performed or assessed with clinical judgement   ADL Overall ADL's : Needs assistance/impaired                                       General ADL Comments: SUPERVISION for standing grooming at sink    Extremity/Trunk Assessment Upper Extremity Assessment Upper Extremity Assessment: Right hand dominant;LUE deficits/detail LUE Deficits / Details:  4/5 grip strength LUE Coordination: decreased fine motor   Lower Extremity Assessment Lower Extremity Assessment: Overall WFL for tasks assessed        Vision       Perception     Praxis     Communication Communication Communication: No apparent difficulties   Cognition Arousal: Alert Behavior During Therapy: WFL for tasks assessed/performed Cognition: No apparent impairments                               Following commands: Intact        Cueing   Cueing Techniques: Verbal cues, Gestural cues  Exercises Other Exercises Other Exercises: reviewed HEP for L hand    Shoulder Instructions       General Comments      Pertinent Vitals/ Pain       Pain Assessment Pain Assessment: 0-10 Pain Score: 5  Pain Descriptors / Indicators: Aching, Grimacing, Sharp Pain Intervention(s): Limited activity within patient's tolerance, Monitored during session, Repositioned  Home Living  Prior Functioning/Environment              Frequency  Min 2X/week        Progress Toward Goals  OT Goals(current goals can now be found in the care plan section)  Progress towards OT goals: Progressing toward goals  Acute Rehab OT Goals OT Goal Formulation: With patient Time For Goal Achievement: 08/30/23 Potential to Achieve Goals: Good ADL Goals Pt Will Perform Tub/Shower Transfer: Independently;shower seat;ambulating Pt/caregiver will Perform Home Exercise Program: Increased ROM;Increased strength;Both right and left upper extremity;With written HEP provided;With theraputty;Independently Additional ADL Goal #1: Pt will be educated on wear schedule for splint (as ordered by MD) and demo doffing/donning independently to promote functional use and neutral positioning of LUE  Plan      Co-evaluation                 AM-PAC OT 6 Clicks Daily Activity     Outcome Measure   Help from another person  eating meals?: None Help from another person taking care of personal grooming?: None Help from another person toileting, which includes using toliet, bedpan, or urinal?: None Help from another person bathing (including washing, rinsing, drying)?: A Little Help from another person to put on and taking off regular upper body clothing?: None Help from another person to put on and taking off regular lower body clothing?: None 6 Click Score: 23    End of Session    OT Visit Diagnosis: Unsteadiness on feet (R26.81);Muscle weakness (generalized) (M62.81);Other abnormalities of gait and mobility (R26.89)   Activity Tolerance Patient tolerated treatment well   Patient Left in chair;with call bell/phone within reach   Nurse Communication          Time: 1000-1015 OT Time Calculation (min): 15 min  Charges: OT General Charges $OT Visit: 1 Visit OT Treatments $Therapeutic Activity: 8-22 mins  [place], Student OT   [employer] 08/16/2023, 11:52 AM

## 2023-08-16 NOTE — Progress Notes (Signed)
   Neurosurgery Progress Note  History: Neil Parks is s/p C5-T1 ACDF for severe cervical radiculopathy  POD2: Patient had significant pain overnight, but it has improved this morning.  POD1: Doing well this morning with improvement of his pre-op arm pain.  Physical Exam: Vitals:   08/16/23 0500 08/16/23 0752  BP: 132/78 (!) 144/88  Pulse: 64 65  Resp: 18 17  Temp: 98 F (36.7 C) 98.2 F (36.8 C)  SpO2: 98% 99%    AA Ox3 CNI  Strength: Side Biceps Triceps Deltoid Interossei Grip Wrist Ext. Wrist Flex.  R 5 5 5 5 5 5 5   L 5 4+ 5 3 3 2 3    Incisions c/d/i   Data:  Other tests/results: see results review  Assessment/Plan:  Neil Parks is a 58 y.o presenting with new weakness and severe cervical foraminal stenosis s/p C5-T1 ACDF   - mobilize - pain control - DVT prophylaxis - PTOT - remainder of care per primary team  Lyle Decamp PA-C Department of Neurosurgery

## 2023-08-16 NOTE — Discharge Summary (Addendum)
 Physician Discharge Summary  PROSPERO MAHNKE FMW:[id number] DOB: [birth year] DOA: 08/12/2023  PCP: Patient, No Pcp Per  Admit date: 08/12/2023 Discharge date: 08/16/2023  Admitted From: home  Disposition:  home   Recommendations for Outpatient Follow-up:  Follow up with PCP in 1-2 weeks F/u w/ neuro surg, Dr. Claudene, in 1 week  Home Health: no  Equipment/Devices:  Discharge Condition: stable  CODE STATUS: full  Diet recommendation: Heart Healthy  Brief/Interim Summary: HPI was taken from Dr. Fredirick: Neil Parks is a 58 y.o. male with medical history significant of nothing who presents with continued neck and back pain.  He works for [employer] and had a scheduled MRI this past week though he has been out of town.  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  Today at a barbecue he lost control of his left arm and hand and was unable to use it.  He came into the ED and was noted to have significant stenosis at C6-C7 and C7-T1 and neurosurgery was called and recommended the patient remain n.p.o. for possible surgery in the a.m. plus steroids.   Discharge Diagnoses:  Principal Problem:   Radiculopathy affecting upper extremity Active Problems:   Left hand weakness   Foraminal stenosis of cervical region  Severe cervical radiculopathy: w/ LUE weakness. S/p C5-T1 anterior cervical discectomy & fusion on 08/14/23 as per neuro surg.S/p IV decadron . Oxy, robaxin  prn for pain   Illicit drug use: urine drug screen was positive for cocaine and cannabinoid use. Cessation counseling already done   Leukocytosis: resolved   Hyperglycemia: no hx of DM. Possibly secondary to decadron  use. Will continue to monitor   Discharge Instructions  Discharge Instructions     Diet general   Complete by: As directed    Discharge instructions   Complete by: As directed    F/u w/ neuro surg, Dr. Claudene, in 1 week. F/u w/ PCP in 1-2 weeks   Incentive spirometry RT   Complete by: As directed     Increase activity slowly   Complete by: As directed    No wound care   Complete by: As directed       Allergies as of 08/16/2023   No Known Allergies      Medication List     STOP taking these medications    cyclobenzaprine  5 MG tablet Commonly known as: FLEXERIL    etodolac  400 MG tablet Commonly known as: LODINE        TAKE these medications    methocarbamol  500 MG tablet Commonly known as: Robaxin  Take 1 tablet (500 mg total) by mouth every 6 (six) hours as needed for up to 7 days for muscle spasms. What changed:  how much to take when to take this reasons to take this   nabumetone  750 MG tablet Commonly known as: RELAFEN  Take 1 tablet (750 mg total) by mouth 2 (two) times daily.   Oxycodone  HCl 10 MG Tabs Take 1 tablet (10 mg total) by mouth every 4 (four) hours as needed for up to 7 days for moderate pain (pain score 4-6) or severe pain (pain score 7-10).        No Known Allergies  Consultations: Neuro surg    Procedures/Studies: DG Cervical Spine 2-3 Views Result Date: 08/14/2023 CLINICAL DATA:  Elective surgery. EXAM: CERVICAL SPINE - 2-3 VIEW COMPARISON:  None Available. FINDINGS: Two fluoroscopic spot views of the cervical spine submitted from the operating room. Anterior fusion C5 through T1. Interbody spacers are  seen at C5-C6 and C6-C7. Lower aspect of the hardware is not well visualized on the lateral view due to soft tissue attenuation. Fluoroscopy time 20 seconds. Dose 3.29 mGy. IMPRESSION: Intraoperative fluoroscopy during cervical fusion. Electronically Signed   By: Andrea Gasman M.D.   On: 08/14/2023 11:47   DG C-Arm 1-60 Min-No Report Result Date: 08/14/2023 Fluoroscopy was utilized by the requesting physician.  No radiographic interpretation.   DG C-Arm 1-60 Min-No Report Result Date: 08/14/2023 Fluoroscopy was utilized by the requesting physician.  No radiographic interpretation.   DG C-Arm 1-60 Min-No Report Result Date:  08/14/2023 Fluoroscopy was utilized by the requesting physician.  No radiographic interpretation.   CT CERVICAL SPINE WO CONTRAST Result Date: 08/13/2023 CLINICAL DATA:  Cervical radiculopathy. Continued neck and back pain. Loss of control of left arm and hand. EXAM: CT CERVICAL SPINE WITHOUT CONTRAST TECHNIQUE: Multidetector CT imaging of the cervical spine was performed without intravenous contrast. Multiplanar CT image reconstructions were also generated. RADIATION DOSE REDUCTION: This exam was performed according to the departmental dose-optimization program which includes automated exposure control, adjustment of the mA and/or kV according to patient size and/or use of iterative reconstruction technique. COMPARISON:  MR cervical spine 08/12/2023. CT cervical spine 10/29/2015. FINDINGS: Alignment: Straightening without focal angulation or listhesis. Skull base and vertebrae: No evidence of acute cervical spine fracture or traumatic subluxation. Chronic endplate degenerative changes have mildly progressed compared with the remote CT from 2017, most advanced from C4-5 through C7-T1. Chronic ossification of the ligamentum nuchae. Soft tissues and spinal canal: No prevertebral fluid or swelling. No visible canal hematoma. Disc levels: As correlated with recent MRI, and no significant spinal stenosis, foraminal narrowing or nerve root impingement at C2-3 or C3-4. C4-5: Moderate loss of disc height with disc bulging, endplate osteophytes and bilateral uncinate spurring. Mild foraminal narrowing bilaterally. C5-6: Moderate loss of disc height with posterior osteophytes covering diffusely bulging disc material. Resulting mild to moderate multifactorial spinal stenosis and moderate to severe foraminal narrowing bilaterally, as seen on MRI. C6-7: Moderate loss of disc height with posterior osteophytes covering diffusely bulging disc material. Asymmetric uncinate spurring on the left with moderate to severe left  foraminal narrowing and possible left C7 nerve root impingement. C7-T1: Advanced loss of disc height with posterior osteophytes covering diffusely bulging disc material. Asymmetric disc osteophyte complex within the left foramen with resulting severe left foraminal narrowing and probable left C8 nerve root impingement. Moderate to severe right foraminal narrowing. Upper chest: Clear lung apices. Other: None. IMPRESSION: 1. No evidence of acute cervical spine fracture, traumatic subluxation or static signs of instability. 2. Multilevel cervical spondylosis as described, mildly progressed compared with remote CT from 2017. 3. Individual disc space levels are detailed on previous day MRI. Mild to moderate multifactorial spinal stenosis at C5-6. Multilevel foraminal narrowing as described, greatest C5-6, C6-7 and C7-T1. Electronically Signed   By: Elsie Perone M.D.   On: 08/13/2023 11:34   DG Cervical Spine With Flex & Extend Result Date: 08/13/2023 EXAM: Flexion and Extension VIEW(S) XRAY OF THE CERVICAL SPINE 08/13/2023 01:27:00 AM COMPARISON: None available. CLINICAL HISTORY: Evaluate for radiculopathy. Pt states that he was injured at work on 07/18/23 and has been having neck and left arm pain since. FINDINGS: BONES: No acute fracture. No aggressive appearing osseous lesion. Alignment is normal. No abnormal motion with flexion/extension. DISCS AND DEGENERATIVE CHANGES: Mild degenerative changes in the mid/lower cervical spine. Mild narrowing of the right C5-6 neural foramen. Moderate narrowing of the right C7-T1  neural foramen. Evaluation of the left neural foramina is constrained by obliquity, but mild narrowing is suspected at C5-6. SOFT TISSUES: No prevertebral soft tissue swelling. The visualized lungs appear clear. IMPRESSION: 1. No abnormal motion with flexion/extension. 2. Mild degenerative changes in the mid/lower cervical spine. 3. Bilateral neural foraminal narrowing, as above. Electronically signed  by: Pinkie Pebbles MD 08/13/2023 01:38 AM EDT RP Workstation: [id number]   MR Cervical Spine W and Wo Contrast Result Date: 08/13/2023 EXAM: MRI BRAIN WITHOUT CONTRAST MRI CERVICAL SPINE WITHOUT AND WITH CONTRAST MRI THORACIC SPINE WITHOUT AND WITH CONTRAST 08/12/2023 11:54:41 PM TECHNIQUE: Multiplanar multisequence MRI of the brain was performed without the administration of intravenous contrast. Multiplanar multisequence MRI of the cervical and thoracic spine was performed without and with the administration of intravenous contrast. CONTRAST: 10mL gadavist  COMPARISON: None available. CLINICAL HISTORY: Unable to flex left hand, most consistent with radiculopathy, but risk factors for CVA. FINDINGS: MRI BRAIN: BRAIN AND VENTRICLES: No acute infarct. No acute intracranial hemorrhage. No mass or abnormal enhancement. No midline shift. No hydrocephalus. The sella is unremarkable. Normal flow voids. ORBITS: No acute abnormality. SINUSES AND MASTOIDS: Mild ethmoid sinus mucosal thickening. BONES AND SOFT TISSUES: Normal bone marrow signal. No acute soft tissue abnormality. MRI CERVICAL AND THORACIC SPINE: BONES AND ALIGNMENT: Mild degenerative height loss at the C4-6 levels. No abnormal enhancement. SPINAL CORD: No focal lesion of the spinal cord. SOFT TISSUES: Unremarkable. C2-C3: No significant disc herniation. No spinal canal stenosis or neural foraminal narrowing. C3-C4: Small central disc protrusion without stenosis. C4-C5: No significant disc herniation. No spinal canal stenosis or neural foraminal narrowing. C5-C6: Small disc osteophyte complex with mild spinal canal stenosis and severe bilateral foraminal stenosis. C6-C7: Left asymmetric disc bulge with severe left foraminal stenosis. C7-T1: Small disc bulge with severe bilateral foraminal stenosis. No disk herniation, spinal canal stenosis or neural foraminal stenosis of the thoracic spine. IMPRESSION: 1. No acute intracranial abnormality or abnormal  enhancement. 2. C5-6 small disc osteophyte complex with mild spinal canal stenosis and severe bilateral foraminal stenosis. 3. C6-7 left asymmetric disc bulge with severe left foraminal stenosis. 4. C7-T1 small disc bulge with severe bilateral foraminal stenosis. Electronically signed by: Franky Stanford MD 08/13/2023 12:18 AM EDT RP Workstation: [id number]   MR THORACIC SPINE W WO CONTRAST Result Date: 08/13/2023 EXAM: MRI BRAIN WITHOUT CONTRAST MRI CERVICAL SPINE WITHOUT AND WITH CONTRAST MRI THORACIC SPINE WITHOUT AND WITH CONTRAST 08/12/2023 11:54:41 PM TECHNIQUE: Multiplanar multisequence MRI of the brain was performed without the administration of intravenous contrast. Multiplanar multisequence MRI of the cervical and thoracic spine was performed without and with the administration of intravenous contrast. CONTRAST: 10mL gadavist  COMPARISON: None available. CLINICAL HISTORY: Unable to flex left hand, most consistent with radiculopathy, but risk factors for CVA. FINDINGS: MRI BRAIN: BRAIN AND VENTRICLES: No acute infarct. No acute intracranial hemorrhage. No mass or abnormal enhancement. No midline shift. No hydrocephalus. The sella is unremarkable. Normal flow voids. ORBITS: No acute abnormality. SINUSES AND MASTOIDS: Mild ethmoid sinus mucosal thickening. BONES AND SOFT TISSUES: Normal bone marrow signal. No acute soft tissue abnormality. MRI CERVICAL AND THORACIC SPINE: BONES AND ALIGNMENT: Mild degenerative height loss at the C4-6 levels. No abnormal enhancement. SPINAL CORD: No focal lesion of the spinal cord. SOFT TISSUES: Unremarkable. C2-C3: No significant disc herniation. No spinal canal stenosis or neural foraminal narrowing. C3-C4: Small central disc protrusion without stenosis. C4-C5: No significant disc herniation. No spinal canal stenosis or neural foraminal narrowing. C5-C6: Small disc osteophyte complex with mild  spinal canal stenosis and severe bilateral foraminal stenosis. C6-C7: Left  asymmetric disc bulge with severe left foraminal stenosis. C7-T1: Small disc bulge with severe bilateral foraminal stenosis. No disk herniation, spinal canal stenosis or neural foraminal stenosis of the thoracic spine. IMPRESSION: 1. No acute intracranial abnormality or abnormal enhancement. 2. C5-6 small disc osteophyte complex with mild spinal canal stenosis and severe bilateral foraminal stenosis. 3. C6-7 left asymmetric disc bulge with severe left foraminal stenosis. 4. C7-T1 small disc bulge with severe bilateral foraminal stenosis. Electronically signed by: Franky Stanford MD 08/13/2023 12:18 AM EDT RP Workstation: [id number]   MR BRAIN WO CONTRAST Result Date: 08/13/2023 EXAM: MRI BRAIN WITHOUT CONTRAST MRI CERVICAL SPINE WITHOUT AND WITH CONTRAST MRI THORACIC SPINE WITHOUT AND WITH CONTRAST 08/12/2023 11:54:41 PM TECHNIQUE: Multiplanar multisequence MRI of the brain was performed without the administration of intravenous contrast. Multiplanar multisequence MRI of the cervical and thoracic spine was performed without and with the administration of intravenous contrast. CONTRAST: 10mL gadavist  COMPARISON: None available. CLINICAL HISTORY: Unable to flex left hand, most consistent with radiculopathy, but risk factors for CVA. FINDINGS: MRI BRAIN: BRAIN AND VENTRICLES: No acute infarct. No acute intracranial hemorrhage. No mass or abnormal enhancement. No midline shift. No hydrocephalus. The sella is unremarkable. Normal flow voids. ORBITS: No acute abnormality. SINUSES AND MASTOIDS: Mild ethmoid sinus mucosal thickening. BONES AND SOFT TISSUES: Normal bone marrow signal. No acute soft tissue abnormality. MRI CERVICAL AND THORACIC SPINE: BONES AND ALIGNMENT: Mild degenerative height loss at the C4-6 levels. No abnormal enhancement. SPINAL CORD: No focal lesion of the spinal cord. SOFT TISSUES: Unremarkable. C2-C3: No significant disc herniation. No spinal canal stenosis or neural foraminal narrowing. C3-C4:  Small central disc protrusion without stenosis. C4-C5: No significant disc herniation. No spinal canal stenosis or neural foraminal narrowing. C5-C6: Small disc osteophyte complex with mild spinal canal stenosis and severe bilateral foraminal stenosis. C6-C7: Left asymmetric disc bulge with severe left foraminal stenosis. C7-T1: Small disc bulge with severe bilateral foraminal stenosis. No disk herniation, spinal canal stenosis or neural foraminal stenosis of the thoracic spine. IMPRESSION: 1. No acute intracranial abnormality or abnormal enhancement. 2. C5-6 small disc osteophyte complex with mild spinal canal stenosis and severe bilateral foraminal stenosis. 3. C6-7 left asymmetric disc bulge with severe left foraminal stenosis. 4. C7-T1 small disc bulge with severe bilateral foraminal stenosis. Electronically signed by: Franky Stanford MD 08/13/2023 12:18 AM EDT RP Workstation: [id number]   CT HEAD WO CONTRAST Result Date: 08/12/2023 EXAM: CT HEAD WITHOUT CONTRAST 08/12/2023 09:58:42 PM TECHNIQUE: CT of the head was performed without the administration of intravenous contrast. Automated exposure control, iterative reconstruction, and/or weight based adjustment of the mA/kV was utilized to reduce the radiation dose to as low as reasonably achievable. COMPARISON: None available. CLINICAL HISTORY: Numbness or tingling, paresthesia (Ped 0-17y). Pt complaints of left arm pain and numbness; Per pt I was supposed to have an mri of my back on 7/18 but didn't go. I was moving some clothes (nothing heavy) went and sat on the porch started to pick up my cigar and my arm just stopped working right.; Per pt this all happened around noon today. FINDINGS: BRAIN AND VENTRICLES: No acute hemorrhage. Gray-white differentiation is preserved. No hydrocephalus. No extra-axial collection. No mass effect or midline shift. ORBITS: No acute abnormality. SINUSES: Mild paranasal sinus mucosal thickening. SOFT TISSUES AND SKULL: No acute  soft tissue abnormality. No skull fracture. IMPRESSION: 1. No acute intracranial abnormality. Electronically signed by: Franky Stanford MD 08/12/2023 10:02 PM  EDT RP Workstation: [id number]   (Echo, Carotid, EGD, Colonoscopy, ERCP)    Subjective: Pt c/o intermittent neck pain    Discharge Exam: Vitals:   08/16/23 0500 08/16/23 0752  BP: 132/78 (!) 144/88  Pulse: 64 65  Resp: 18 17  Temp: 98 F (36.7 C) 98.2 F (36.8 C)  SpO2: 98% 99%   Vitals:   08/15/23 1717 08/15/23 1950 08/16/23 0500 08/16/23 0752  BP: (!) 138/91 130/89 132/78 (!) 144/88  Pulse: (!) 57 (!) 58 64 65  Resp: 18 18 18 17   Temp: 98.2 F (36.8 C) 97.8 F (36.6 C) 98 F (36.7 C) 98.2 F (36.8 C)  TempSrc: Oral  Oral Oral  SpO2: 96% 99% 98% 99%  Weight:      Height:        General: Pt is alert, awake, not in acute distress Cardiovascular: S1/S2 +, no rubs, no gallops Respiratory: CTA bilaterally, no wheezing, no rhonchi Abdominal: Soft, NT, ND, bowel sounds + Extremities: no edema, no cyanosis    The results of significant diagnostics from this hospitalization (including imaging, microbiology, ancillary and laboratory) are listed below for reference.     Microbiology: Recent Results (from the past 240 hours)  Surgical PCR screen     Status: None   Collection Time: 08/13/23  1:50 AM   Specimen: Nasal Mucosa; Nasal Swab  Result Value Ref Range Status   MRSA, PCR NEGATIVE NEGATIVE Final   Staphylococcus aureus NEGATIVE NEGATIVE Final    Comment: (NOTE) The Xpert SA Assay (FDA approved for NASAL specimens in patients 39 years of age and older), is one component of a comprehensive surveillance program. It is not intended to diagnose infection nor to guide or monitor treatment. Performed at [facility], [address] [place], [place] [address]      Labs: BNP (last 3 results) No results for input(s): BNP in the last 8760 hours. Basic Metabolic Panel: Recent Labs  Lab 08/12/23 2212  08/15/23 0445 08/16/23 0415  NA 140 136 138  K 3.8 3.7 4.0  CL 104 106 106  CO2 27 24 25   GLUCOSE 97 111* 102*  BUN 13 17 18   CREATININE 0.98 0.51* 0.63  CALCIUM 8.9 8.4* 8.3*  MG  --  1.9 1.8   Liver Function Tests: Recent Labs  Lab 08/12/23 2212  AST 90*  ALT 47*  ALKPHOS 117  BILITOT 0.7  PROT 8.1  ALBUMIN 3.6   No results for input(s): LIPASE, AMYLASE in the last 168 hours. No results for input(s): AMMONIA in the last 168 hours. CBC: Recent Labs  Lab 08/12/23 2212 08/15/23 0445 08/16/23 0415  WBC 5.4 15.3* 7.8  NEUTROABS 2.8  --   --   HGB 14.8 13.4 13.7  HCT 44.0 40.1 40.5  MCV 87.5 88.5 88.8  PLT 197 190 183   Cardiac Enzymes: Recent Labs  Lab 08/13/23 0426  CKTOTAL 154   BNP: Invalid input(s): POCBNP CBG: No results for input(s): GLUCAP in the last 168 hours. D-Dimer No results for input(s): DDIMER in the last 72 hours. Hgb A1c No results for input(s): HGBA1C in the last 72 hours. Lipid Profile No results for input(s): CHOL, HDL, LDLCALC, TRIG, CHOLHDL, LDLDIRECT in the last 72 hours. Thyroid function studies No results for input(s): TSH, T4TOTAL, T3FREE, THYROIDAB in the last 72 hours.  Invalid input(s): FREET3 Anemia work up No results for input(s): VITAMINB12, FOLATE, FERRITIN, TIBC, IRON, RETICCTPCT in the last 72 hours. Urinalysis    Component Value  Date/Time   COLORURINE Yellow 10/06/2013 1316   APPEARANCEUR Hazy 10/06/2013 1316   LABSPEC 1.027 10/06/2013 1316   PHURINE 5.0 10/06/2013 1316   GLUCOSEU Negative 10/06/2013 1316   HGBUR Negative 10/06/2013 1316   BILIRUBINUR Negative 10/06/2013 1316   KETONESUR Negative 10/06/2013 1316   PROTEINUR Negative 10/06/2013 1316   NITRITE Negative 10/06/2013 1316   LEUKOCYTESUR Negative 10/06/2013 1316   Sepsis Labs Recent Labs  Lab 08/12/23 2212 08/15/23 0445 08/16/23 0415  WBC 5.4 15.3* 7.8   Microbiology Recent Results (from the  past 240 hours)  Surgical PCR screen     Status: None   Collection Time: 08/13/23  1:50 AM   Specimen: Nasal Mucosa; Nasal Swab  Result Value Ref Range Status   MRSA, PCR NEGATIVE NEGATIVE Final   Staphylococcus aureus NEGATIVE NEGATIVE Final    Comment: (NOTE) The Xpert SA Assay (FDA approved for NASAL specimens in patients 90 years of age and older), is one component of a comprehensive surveillance program. It is not intended to diagnose infection nor to guide or monitor treatment. Performed at [facility], [address], [place], [place] [address]      Time coordinating discharge: 37 minutes  SIGNED:   Anthony CHRISTELLA Pouch, MD  [facility] 08/16/2023, 1:16 PM Pager   If 7PM-7AM, please contact night-coverage [email or web address]

## 2023-08-16 NOTE — Plan of Care (Signed)

## 2023-08-17 ENCOUNTER — Encounter: Payer: Self-pay | Admitting: Neurosurgery

## 2023-08-22 ENCOUNTER — Encounter: Payer: Self-pay | Admitting: Emergency Medicine

## 2023-08-22 ENCOUNTER — Inpatient Hospital Stay
Admission: EM | Admit: 2023-08-22 | Discharge: 2023-08-24 | DRG: 607 | Disposition: A | Discharge status: Home / Self Care | Payer: Self-pay | Attending: Internal Medicine | Admitting: Internal Medicine

## 2023-08-22 ENCOUNTER — Inpatient Hospital Stay: Payer: Self-pay

## 2023-08-22 ENCOUNTER — Emergency Department: Payer: Self-pay

## 2023-08-22 ENCOUNTER — Other Ambulatory Visit: Payer: Self-pay

## 2023-08-22 DIAGNOSIS — R1310 Dysphagia, unspecified: Secondary | ICD-10-CM | POA: Diagnosis present

## 2023-08-22 DIAGNOSIS — R768 Other specified abnormal immunological findings in serum: Secondary | ICD-10-CM | POA: Diagnosis present

## 2023-08-22 DIAGNOSIS — Z720 Tobacco use: Secondary | ICD-10-CM | POA: Diagnosis present

## 2023-08-22 DIAGNOSIS — M5410 Radiculopathy, site unspecified: Secondary | ICD-10-CM | POA: Diagnosis present

## 2023-08-22 DIAGNOSIS — Z5902 Unsheltered homelessness: Secondary | ICD-10-CM

## 2023-08-22 DIAGNOSIS — M4802 Spinal stenosis, cervical region: Secondary | ICD-10-CM | POA: Diagnosis present

## 2023-08-22 DIAGNOSIS — M5412 Radiculopathy, cervical region: Secondary | ICD-10-CM | POA: Diagnosis present

## 2023-08-22 DIAGNOSIS — F1410 Cocaine abuse, uncomplicated: Secondary | ICD-10-CM | POA: Diagnosis present

## 2023-08-22 DIAGNOSIS — R221 Localized swelling, mass and lump, neck: Principal | ICD-10-CM | POA: Diagnosis present

## 2023-08-22 DIAGNOSIS — F1210 Cannabis abuse, uncomplicated: Secondary | ICD-10-CM | POA: Diagnosis present

## 2023-08-22 DIAGNOSIS — R29898 Other symptoms and signs involving the musculoskeletal system: Secondary | ICD-10-CM | POA: Diagnosis present

## 2023-08-22 DIAGNOSIS — F17200 Nicotine dependence, unspecified, uncomplicated: Secondary | ICD-10-CM | POA: Diagnosis present

## 2023-08-22 DIAGNOSIS — R945 Abnormal results of liver function studies: Secondary | ICD-10-CM | POA: Diagnosis present

## 2023-08-22 DIAGNOSIS — R531 Weakness: Secondary | ICD-10-CM | POA: Diagnosis present

## 2023-08-22 DIAGNOSIS — B1920 Unspecified viral hepatitis C without hepatic coma: Secondary | ICD-10-CM | POA: Diagnosis present

## 2023-08-22 DIAGNOSIS — E663 Overweight: Secondary | ICD-10-CM | POA: Diagnosis present

## 2023-08-22 DIAGNOSIS — Z79899 Other long term (current) drug therapy: Secondary | ICD-10-CM

## 2023-08-22 DIAGNOSIS — Z981 Arthrodesis status: Secondary | ICD-10-CM

## 2023-08-22 DIAGNOSIS — F1910 Other psychoactive substance abuse, uncomplicated: Secondary | ICD-10-CM | POA: Diagnosis present

## 2023-08-22 HISTORY — DX: Spinal stenosis, cervical region: M48.02

## 2023-08-22 HISTORY — DX: Other psychoactive substance abuse, uncomplicated: F19.10

## 2023-08-22 HISTORY — DX: Cocaine abuse, uncomplicated: F14.10

## 2023-08-22 HISTORY — DX: Tobacco use: Z72.0

## 2023-08-22 HISTORY — DX: Overweight: E66.3

## 2023-08-22 HISTORY — DX: Cannabis use, unspecified, uncomplicated: F12.90

## 2023-08-22 LAB — COMPREHENSIVE METABOLIC PANEL WITH GFR
ALT: 59 U/L — ABNORMAL HIGH (ref 0–44)
AST: 81 U/L — ABNORMAL HIGH (ref 15–41)
Albumin: 3.2 g/dL — ABNORMAL LOW (ref 3.5–5.0)
Alkaline Phosphatase: 121 U/L (ref 38–126)
Anion gap: 10 (ref 5–15)
BUN: 10 mg/dL (ref 6–20)
CO2: 23 mmol/L (ref 22–32)
Calcium: 8.7 mg/dL — ABNORMAL LOW (ref 8.9–10.3)
Chloride: 103 mmol/L (ref 98–111)
Creatinine, Ser: 0.88 mg/dL (ref 0.61–1.24)
GFR, Estimated: >60 mL/min (ref >60)
Glucose, Bld: 155 mg/dL — ABNORMAL HIGH (ref 70–99)
Potassium: 3.6 mmol/L (ref 3.5–5.1)
Sodium: 136 mmol/L (ref 135–145)
Total Bilirubin: 0.7 mg/dL (ref 0.0–1.2)
Total Protein: 7.1 g/dL (ref 6.5–8.1)

## 2023-08-22 LAB — CBC WITH DIFFERENTIAL/PLATELET
Abs Immature Granulocytes: 0.02 K/uL (ref 0.00–0.07)
Basophils Absolute: 0.1 K/uL (ref 0.0–0.1)
Basophils Relative: 1 %
Eosinophils Absolute: 0.4 K/uL (ref 0.0–0.5)
Eosinophils Relative: 5 %
HCT: 41.7 % (ref 39.0–52.0)
Hemoglobin: 14.3 g/dL (ref 13.0–17.0)
Immature Granulocytes: 0 %
Lymphocytes Relative: 23 %
Lymphs Abs: 1.8 K/uL (ref 0.7–4.0)
MCH: 30.4 pg (ref 26.0–34.0)
MCHC: 34.3 g/dL (ref 30.0–36.0)
MCV: 88.5 fL (ref 80.0–100.0)
Monocytes Absolute: 0.6 K/uL (ref 0.1–1.0)
Monocytes Relative: 8 %
Neutro Abs: 5.1 K/uL (ref 1.7–7.7)
Neutrophils Relative %: 63 %
Platelets: 319 K/uL (ref 150–400)
RBC: 4.71 MIL/uL (ref 4.22–5.81)
RDW: 13.9 % (ref 11.5–15.5)
WBC: 7.9 K/uL (ref 4.0–10.5)
nRBC: 0 % (ref 0.0–0.2)

## 2023-08-22 LAB — PROTIME-INR
INR: 1 (ref 0.8–1.2)
Prothrombin Time: 14 s (ref 11.4–15.2)

## 2023-08-22 LAB — GLUCOSE, CAPILLARY: Glucose-Capillary: 115 mg/dL — ABNORMAL HIGH (ref 70–99)

## 2023-08-22 LAB — ACETAMINOPHEN LEVEL: Acetaminophen (Tylenol), Serum: 11 ug/mL (ref 10–30)

## 2023-08-22 LAB — APTT: aPTT: 28 s (ref 24–36)

## 2023-08-22 MED ORDER — METHOCARBAMOL 500 MG PO TABS
500.0000 mg | ORAL_TABLET | Freq: Three times a day (TID) | ORAL | Status: DC | PRN
  Administered 2023-08-23 – 2023-08-24 (×3): 500 mg via ORAL
  Filled 2023-08-22 (×4): qty 1

## 2023-08-22 MED ORDER — OXYCODONE HCL 5 MG PO TABS
5.0000 mg | ORAL_TABLET | Freq: Four times a day (QID) | ORAL | Status: DC | PRN
  Administered 2023-08-23 – 2023-08-24 (×5): 5 mg via ORAL
  Filled 2023-08-22 (×5): qty 1

## 2023-08-22 MED ORDER — NICOTINE 21 MG/24HR TD PT24
21.0000 mg | MEDICATED_PATCH | Freq: Every day | TRANSDERMAL | Status: DC
  Filled 2023-08-22: qty 1

## 2023-08-22 MED ORDER — ONDANSETRON HCL 4 MG/2ML IJ SOLN: 4.0000 mg | Freq: Three times a day (TID) | INTRAMUSCULAR | Status: DC | PRN

## 2023-08-22 MED ORDER — DEXAMETHASONE SODIUM PHOSPHATE 10 MG/ML IJ SOLN
10.0000 mg | Freq: Once | INTRAMUSCULAR | Status: AC
  Administered 2023-08-22: 10 mg via INTRAVENOUS
  Filled 2023-08-22: qty 1

## 2023-08-22 MED ORDER — DEXAMETHASONE SODIUM PHOSPHATE 10 MG/ML IJ SOLN
10.0000 mg | Freq: Four times a day (QID) | INTRAMUSCULAR | Status: DC
  Administered 2023-08-23 – 2023-08-24 (×6): 10 mg via INTRAVENOUS
  Filled 2023-08-22 (×7): qty 1

## 2023-08-22 MED ORDER — IOHEXOL 300 MG/ML  SOLN
75.0000 mL | Freq: Once | INTRAMUSCULAR | Status: AC | PRN
  Administered 2023-08-22: 75 mL via INTRAVENOUS

## 2023-08-22 MED ORDER — DM-GUAIFENESIN ER 30-600 MG PO TB12
1.0000 | ORAL_TABLET | Freq: Two times a day (BID) | ORAL | Status: DC | PRN
  Administered 2023-08-22 – 2023-08-24 (×3): 1 via ORAL
  Filled 2023-08-22 (×3): qty 1

## 2023-08-22 MED ORDER — SODIUM CHLORIDE 0.9 % IV SOLN: INTRAVENOUS | Status: DC

## 2023-08-22 MED ORDER — IBUPROFEN 400 MG PO TABS: 400.0000 mg | ORAL_TABLET | Freq: Four times a day (QID) | ORAL | Status: DC | PRN

## 2023-08-22 MED ORDER — DEXAMETHASONE SODIUM PHOSPHATE 10 MG/ML IJ SOLN: 10.0000 mg | Freq: Three times a day (TID) | INTRAMUSCULAR | Status: DC

## 2023-08-22 MED ORDER — OXYCODONE HCL 5 MG PO TABS
10.0000 mg | ORAL_TABLET | Freq: Once | ORAL | Status: AC
  Administered 2023-08-22: 10 mg via ORAL
  Filled 2023-08-22: qty 2

## 2023-08-22 MED ORDER — ALBUTEROL SULFATE (2.5 MG/3ML) 0.083% IN NEBU: 2.5000 mg | INHALATION_SOLUTION | RESPIRATORY_TRACT | Status: DC | PRN

## 2023-08-22 NOTE — ED Triage Notes (Signed)
 Patient to ED via POV for post-op problem. PT reports having surgery last Tuesday that required incision on front of throat. Pt reports when hot that the throat swells up making it hard to breathe. Denies any breathing difficulties at this time. NAD noted. States he was also prescribed pain meds for 7 days but only had enough for 5 days and now is out.

## 2023-08-22 NOTE — Consult Note (Signed)
 Reviewed with Dr. Ernest and Dr. Hilma.  POD8 from C5-T1 ACDF with some difficulty swallowing.  No fevers, chills.  Not overtly ill-appearing, but has neck swelling.  Has been sleeping in his car.  Reports some subjective weakness but is objectively strong per Dr. Ernest.  CT reviewed.  Based on reassuring labs and nontoxic appearance, it is unlikely that he has an abscess.  He does have extensive swelling around his vocal cords.  - Close monitoring in SDU with continuous pulse-ox - C spine xrays today for baseline, will repeat tomorrow - Dex 10q6 - If he worsens clinically with either increased swallowing difficulty, strider, or difficulty breathing, would consider intubation and will take for washout and drain placement - Head of bed >30 degrees  D/w Dr. Hilma

## 2023-08-22 NOTE — ED Notes (Addendum)
 Report called to dustin rn icu nurse

## 2023-08-22 NOTE — ED Provider Notes (Addendum)
 [facility] Provider Note    Event Date/Time   First MD Initiated Contact with Patient 08/22/23 1655     (approximate)   History   Post-op Problem   HPI  Neil Parks is a 58 y.o. male who comes in with concern for postop issue.  Patient had a surgery with Dr. Claudene on 08/14/2023.  He had an issue with his left arm and hand being unable to use it.  He underwent a C5-T1 anterior cervical discectomy and fusion and was on some IV steroids.  Patient reports that he has had some swelling of his incisional sites and that he states that it feels like it is difficult to swallow he is having to cut up his food really small.  He reports it is even worse when he is outside in the heat.  He reports difficulties with his living situation as he does not have anyone he can stay with so he has been staying in his car.  He reports that he could go to a shelter but and they just kicked him out the next day.  He also reports that he has been taking the oxycodone  as scheduled but he ran out of pain medication.  They only gave him enough for 5 days.  He reports last taking pain medication yesterday.  He reports that the strength is improved from after the surgery.  Reported a little bit of fumbling with his hand when he tried to use the deodorant the other day but no severe worsening weakness, numbness.   Physical Exam   Triage Vital Signs: ED Triage Vitals  Encounter Vitals Group     BP 08/22/23 1515 (!) 151/123     Girls Systolic BP Percentile --      Girls Diastolic BP Percentile --      Boys Systolic BP Percentile --      Boys Diastolic BP Percentile --      Pulse Rate 08/22/23 1515 95     Resp 08/22/23 1515 18     Temp 08/22/23 1515 98.5 F (36.9 C)     Temp Source 08/22/23 1515 Oral     SpO2 08/22/23 1515 100 %     Weight 08/22/23 1516 185 lb (83.9 kg)     Height 08/22/23 1516 6' (1.829 m)     Head Circumference --      Peak Flow --      Pain Score 08/22/23 1516  10     Pain Loc --      Pain Education --      Exclude from Growth Chart --     Most recent vital signs: Vitals:   08/22/23 1515  BP: (!) 151/123  Pulse: 95  Resp: 18  Temp: 98.5 F (36.9 C)  SpO2: 100%     General: Awake, no distress.  CV:  Good peripheral perfusion.  Resp:  Normal effort.  Abd:  No distention.  Other:  Picture in media tab. Good grip strength slightly weaker on the left but still strong in nature and does hurt my fingers when he squeezes down.  Able to push out and pull in.  Sensation intact. OP appears clear. Does have some swelling noted on the anterior portion of the neck.   ED Results / Procedures / Treatments   Labs (all labs ordered are listed, but only abnormal results are displayed) Labs Reviewed  COMPREHENSIVE METABOLIC PANEL WITH GFR - Abnormal; Notable for the following components:  Result Value   Glucose, Bld 155 (*)    Calcium 8.7 (*)    Albumin 3.2 (*)    AST 81 (*)    ALT 59 (*)    All other components within normal limits  CBC WITH DIFFERENTIAL/PLATELET     RADIOLOGY I have reviewed the ct personally and interpreted no obvious abscess   PROCEDURES:  Critical Care performed: No  Procedures   MEDICATIONS ORDERED IN ED: Medications - No data to display   IMPRESSION / MDM / ASSESSMENT AND PLAN / ED COURSE  I reviewed the triage vital signs and the nursing notes.   Patient's presentation is most consistent with acute presentation with potential threat to life or bodily function.   Patient comes in with some anterior neck swelling from incisional sites.  Patient picture in media tab does feel like he has got some keloids underneath the incisional sites.  He still able to tolerate secretions speaking with normal voice.  No redness or warmth to suggest abscess.  I have ordered CT imaging after discussion with Dr. Clois to further evaluate.  He did report a little bit of fumbling with the left hand but on examination  he actually has very good strength slightly weaker but still very strong on the left side without any new numbness or tingling.  Discussed this with neurosurgery and it seems less likely that we need to do a MRI and so we will hold off at this time given still very good strength in his hand.  We will give him his home pain medication of oxycodone  10  CBC is reassuring with normal white count CMP shows slight elevation of LFTs but similar to prior.  He denies any daily drinking.  D/w Dr Clois he stated that typically when we do the CTs with contrast that they get read as abscesses but his thought was that this is probably more likely just inflammation, seroma.  He did want to do some IV Decadron  to help with the swelling and an admit to the hospital for monitoring.  Keep head of bed elevated. He is going to review the images to see if we need to start any IV antibiotics given patient is afebrile with normal white count without any redness or warmth to the neck I even did did discuss the case with the radiologist who stated given his known clinical history that it could just be more of the artifact from the hardware and maybe it is just a seroma  D/w NS Dr Clois and wanted to hold antibiotics for now.   Given report of narowing of supraglottic area I re-evaluted pt.  Pt laying flat in bed and told importance of being upright.  Patient is tolerating p.o. , no respiratory distress, tolerating secreation, and is comfortable with admission to the hospitalist for monitoring.      FINAL CLINICAL IMPRESSION(S) / ED DIAGNOSES   Final diagnoses:  Neck swelling     Rx / DC Orders   ED Discharge Orders     None        Note:  This document was prepared using Dragon voice recognition software and may include unintentional dictation errors.   Ernest Ronal BRAVO, MD 08/22/23 ULYESS    Ernest Ronal BRAVO, MD 08/22/23 [phone]

## 2023-08-22 NOTE — H&P (Signed)
 History and Physical    JESHURUN OAXACA FMW:[id number] DOB: 02-26-1965 DOA: 08/22/2023  Referring MD/NP/PA:   PCP: Patient, No Pcp Per   Patient coming from:  The patient is coming from home (living in CAR)     Chief Complaint: Neck pain, painful swallowing  HPI: Neil Parks is a 58 y.o. male with medical history significant of foraminal stenosis of cervical spine, radiculopathy affecting upper extremity with left hand weakness, polysubstance abuse, cocaine abuse, marijuana abuse, tobacco abuse, who presents with neck pain, painful swallowing.  Patient was recently hospitalized from 7/20 - 7/24.  He is s/p C5-T1 anterior cervical discectomy & fusion by Dr. Claudene of neurosurgery on 08/14/23. Pt states that he has been taking the oxycodone  for 5 days as prescribed but he ran out of pain medication. He continues to have pain in the anterior neck incisional site and swelling in the anterior neck.  He has difficult swallowing due to throat pain, so he has to cut up his food really small.  He states that his symptoms is worse when he is outside in the heat.  He has dry cough and mild SOB, denies chest pain, fever or chills.  No nausea, vomiting, diarrhea or abdominal pain.  No symptoms of UTI.  Patient does not have respiratory distress during interview.  Patient states that his strength has been improving after surgery, but still has mild numbness and weakness in his left hand.  He states that he has difficulties with his living situation. He states that he does not have anyone he can stay with so he has been staying in his car.  He reports that he could go to a shelter but and they just kicked him out the next day.    Data reviewed independently and ED Course: pt was found to have WBC 7.9, GFR> 60, temperature normal, blood pressure 141/90, heart rate 95, RR 18, oxygen saturation 98% on room air.  Patient is admitted to stepdown as inpatient.  Dr. Katrina of neurosurgery is consulted.  CT of  neck soft tissue:  IMPRESSION: 1. Peripheral enhancing collection within the right aspect of the retropharynx at the level of C5-6 measuring up to 3.0 cm concerning for abscess. Associated soft tissue swelling extending laterally and exerting mass effect on the right carotid space. 2. Additional fluid noted extending along the posterior medial margin of the right sternocleidomastoid muscle extending into the overlying subcutaneous tissues which may reflect additional abscess. 3. Retropharyngeal effusion extending superiorly to the level of C2. 4. Edematous appearance of the pharyngeal mucosa, likely reactive changes. 5. Likely reactive edema of the aryepiglottic folds with narrowing of the supraglottic airway.    IMPRESSION: 1. Peripheral enhancing collection within the right aspect of the retropharynx at the level of C5-6 measuring up to 3.0 cm concerning for abscess. Associated soft tissue swelling extending laterally and exerting mass effect on the right carotid space. 2. Additional fluid noted extending along the posterior medial margin of the right sternocleidomastoid muscle extending into the overlying subcutaneous tissues which may reflect additional abscess. 3. Retropharyngeal effusion extending superiorly to the level of C2. 4. Edematous appearance of the pharyngeal mucosa, likely reactive changes. 5. Likely reactive edema of the aryepiglottic folds with narrowing of the supraglottic airway. 6. The findings of this study were discussed with Dr. Ernest at 7:30PM on 08/22/2023. Additional clinical history obtained. Given soft tissue swelling without other clinical signs of infection, the findings may represent post operative seroma. The fluid along the sternocleidomastoid  extending anteriorly in the right neck does not demonstrate convincing peripheral enhancement. The collection with the right retropharynx may demonstrate mild peripheral enhancement although evaluation in this  region is limited by streak artifact from hardware. Both areas of fluid may reflect postoperative seroma.      EKG: Not done in ED, will get one.    Review of Systems:   General: no fevers, chills, no body weight gain, has fatigue HEENT: no blurry vision, hearing changes. Has anterior neck pain and swelling in the surgical site. Has sore throat Respiratory: has dyspnea, coughing, no wheezing CV: no chest pain, no palpitations GI: no nausea, vomiting, abdominal pain, diarrhea, constipation GU: no dysuria, burning on urination, increased urinary frequency, hematuria  Ext: no leg edema Neuro: no vision change or hearing loss. Has left hand numbness and weakness Skin: no rash, no skin tear. MSK: No muscle spasm, no deformity, no limitation of range of movement in spin Heme: No easy bruising.  Travel history: No recent long distant travel.   Allergy: No Known Allergies  Past Medical History:  Diagnosis Date   Cocaine abuse (HCC)    Foraminal stenosis of cervical region    Kidney stones    Marijuana use    Overweight (BMI 25.0-29.9)    Polysubstance abuse (HCC)    Tobacco abuse     Past Surgical History:  Procedure Laterality Date   ANTERIOR CERVICAL DECOMP/DISCECTOMY FUSION N/A 08/14/2023   Procedure: C5-T1 Anterior Cervical Discectomy and Fusion;  Surgeon: Claudene Penne ORN, MD;  Location: [facility] ORS;  Service: Neurosurgery;  Laterality: N/A;   FRACTURE SURGERY     HERNIA REPAIR      Social History:  reports that he has been smoking. He has never used smokeless tobacco. He reports current alcohol use. No history on file for drug use.  Family History: I have tried to review with patient about his family medical history, but  patient states that he does not know any detailed information about family medical history.   Prior to Admission medications   Medication Sig Start Date End Date Taking? Authorizing Provider  methocarbamol  (ROBAXIN ) 500 MG tablet Take 1 tablet (500 mg  total) by mouth every 6 (six) hours as needed for up to 7 days for muscle spasms. 08/16/23 08/23/23  Trudy Anthony HERO, MD  nabumetone  (RELAFEN ) 750 MG tablet Take 1 tablet (750 mg total) by mouth 2 (two) times Parks. Patient not taking: Reported on 08/13/2023 10/20/15   Menshew, Candida LULLA Kings, PA-C  oxyCODONE  10 MG TABS Take 1 tablet (10 mg total) by mouth every 4 (four) hours as needed for up to 7 days for moderate pain (pain score 4-6) or severe pain (pain score 7-10). 08/16/23 08/23/23  Trudy Anthony HERO, MD    Physical Exam: Vitals:   08/22/23 1516 08/22/23 1939 08/22/23 2150 08/22/23 2200  BP:  (!) 141/90 (!) 145/90 (!) 142/89  Pulse:  93 72 81  Resp:  18 17 20   Temp:  98.2 F (36.8 C) 98.4 F (36.9 C)   TempSrc:  Oral Oral   SpO2:  98% 98% 98%  Weight: 83.9 kg  75 kg   Height: 6' (1.829 m)      General: Not in acute distress HEENT: No stridor. Has swelling, induration and tenderness over anterior neck surgical site, no active drainage, no warmth            Eyes: PERRL, EOMI, no jaundice       ENT: No discharge from  the ears and nose, no pharynx injection, no tonsillar enlargement.        Neck: No JVD, no bruit Heme: No neck lymph node enlargement. Cardiac: S1/S2, RRR, No murmurs, No gallops or rubs. Respiratory: No rales, wheezing, rhonchi or rubs. GI: Soft, nondistended, nontender, no rebound pain, no organomegaly, BS present. GU: No hematuria Ext: No pitting leg edema bilaterally. 1+DP/PT pulse bilaterally. Musculoskeletal: No joint deformities, No joint redness or warmth, no limitation of ROM in spin. Skin: No rashes.  Neuro: Alert, oriented X3, cranial nerves II-XII grossly intact, moves all extremities normally. Muscle strength 5/5 in all extremities, sensation to light touch intact.  Psych: Patient is not psychotic, no suicidal or hemocidal ideation.  Labs on Admission: I have personally reviewed following labs and imaging studies  CBC: Recent Labs  Lab  08/16/23 0415 08/22/23 1518  WBC 7.8 7.9  NEUTROABS  --  5.1  HGB 13.7 14.3  HCT 40.5 41.7  MCV 88.8 88.5  PLT 183 319   Basic Metabolic Panel: Recent Labs  Lab 08/16/23 0415 08/22/23 1518  NA 138 136  K 4.0 3.6  CL 106 103  CO2 25 23  GLUCOSE 102* 155*  BUN 18 10  CREATININE 0.63 0.88  CALCIUM 8.3* 8.7*  MG 1.8  --    GFR: Estimated Creatinine Clearance: 100.6 mL/min (by C-G formula based on SCr of 0.88 mg/dL). Liver Function Tests: Recent Labs  Lab 08/22/23 1518  AST 81*  ALT 59*  ALKPHOS 121  BILITOT 0.7  PROT 7.1  ALBUMIN 3.2*   No results for input(s): LIPASE, AMYLASE in the last 168 hours. No results for input(s): AMMONIA in the last 168 hours. Coagulation Profile: Recent Labs  Lab 08/22/23 2055  INR 1.0   Cardiac Enzymes: No results for input(s): CKTOTAL, CKMB, CKMBINDEX, TROPONINI in the last 168 hours. BNP (last 3 results) No results for input(s): PROBNP in the last 8760 hours. HbA1C: No results for input(s): HGBA1C in the last 72 hours. CBG: Recent Labs  Lab 08/22/23 2158  GLUCAP 115*   Lipid Profile: No results for input(s): CHOL, HDL, LDLCALC, TRIG, CHOLHDL, LDLDIRECT in the last 72 hours. Thyroid Function Tests: No results for input(s): TSH, T4TOTAL, FREET4, T3FREE, THYROIDAB in the last 72 hours. Anemia Panel: No results for input(s): VITAMINB12, FOLATE, FERRITIN, TIBC, IRON, RETICCTPCT in the last 72 hours. Urine analysis:    Component Value Date/Time   COLORURINE Yellow 10/06/2013 1316   APPEARANCEUR Hazy 10/06/2013 1316   LABSPEC 1.027 10/06/2013 1316   PHURINE 5.0 10/06/2013 1316   GLUCOSEU Negative 10/06/2013 1316   HGBUR Negative 10/06/2013 1316   BILIRUBINUR Negative 10/06/2013 1316   KETONESUR Negative 10/06/2013 1316   PROTEINUR Negative 10/06/2013 1316   NITRITE Negative 10/06/2013 1316   LEUKOCYTESUR Negative 10/06/2013 1316   Sepsis  Labs: @LABRCNTIP (procalcitonin:4,lacticidven:4) ) Recent Results (from the past 240 hours)  Surgical PCR screen     Status: None   Collection Time: 08/13/23  1:50 AM   Specimen: Nasal Mucosa; Nasal Swab  Result Value Ref Range Status   MRSA, PCR NEGATIVE NEGATIVE Final   Staphylococcus aureus NEGATIVE NEGATIVE Final    Comment: (NOTE) The Xpert SA Assay (FDA approved for NASAL specimens in patients 21 years of age and older), is one component of a comprehensive surveillance program. It is not intended to diagnose infection nor to guide or monitor treatment. Performed at [facility], [address] [place], [place] [address]      Radiological Exams  on Admission:   Assessment/Plan Principal Problem:   Foraminal stenosis of cervical region Active Problems:   Radiculopathy affecting upper extremity   Left hand weakness   Abnormal liver function   Tobacco abuse   Polysubstance abuse (HCC)   Cocaine abuse (HCC)   Overweight (BMI 25.0-29.9)   Assessment and Plan:  Foraminal stenosis of cervical spine and radiculopathy affecting upper extremity:  He is s/p C5-T1 anterior cervical discectomy & fusion by Dr. Claudene of neurosurgery on 08/14/23. Pt continues to have neck pain and difficulty swallowing.  No stridor on physical examination.  No oxygen desaturation.  CT scan showed fluid collection.  Consulted Dr. Katrina of neurosurgery, who does not think patient has abscess since patient does not have fever or leukocytosis. He recommended to start steroid.  Per Dr. Katrina, patient has extensive swelling around his vocal cord.  Patient will need to be in stepdown with continuous pulse oximetry measurement. Per Dr. Beverly, if pt has any worsening oxygenation or develops stridor, would have anesthesia or ICU physician to intubate pt, at the same time will need to call Dr. Katrina for urgent washout. I communicated this plan with ICU nurse.  -will admit to stepdown as  inpatient - Pain control: As needed oxycodone  and ibuprofen  - As needed albuterol  and Mucinex  - Decadron  10 mg every 6 hours - Continues pulse oximetry measurement - Head elevation> 30 degree - Keep patient n.p.o. - IV normal saline 75 cc/h  Left hand weakness -PT/OT  Abnormal liver function: Mild abnormal liver function with ALP 121, AST 81, ALT 59, total bilirubin 0.7.  Unclear etiology. -Follow-up hepatitis panel - Check Tylenol  level --> 11  Tobacco abuse, polysubstance abuse, cocaine abuse (HCC) -Due to counseling about importance of quitting substance use - Nicotine  patch  Overweight (BMI 25.0-29.9): Body weight 75 kg, BMI 22.42 - Encourage losing weight - Exercise and healthy diet     DVT ppx: SCD  Code Status: Full code   Family Communication:     not done, no family member is at bed side.    Disposition Plan: To be determined  Consults called:    Admission status and Level of care: Stepdown:    for obs as inpt        Dispo: The patient is from: Home              Anticipated d/c is to: To be determined              Anticipated d/c date is: 2 days              Patient currently is not medically stable to d/c.    Severity of Illness:  The appropriate patient status for this patient is INPATIENT. Inpatient status is judged to be reasonable and necessary in order to provide the required intensity of service to ensure the patient's safety. The patient's presenting symptoms, physical exam findings, and initial radiographic and laboratory data in the context of their chronic comorbidities is felt to place them at high risk for further clinical deterioration. Furthermore, it is not anticipated that the patient will be medically stable for discharge from the hospital within 2 midnights of admission.   * I certify that at the point of admission it is my clinical judgment that the patient will require inpatient hospital care spanning beyond 2 midnights from the point  of admission due to high intensity of service, high risk for further deterioration and high frequency of surveillance required.*  Date of Service 08/23/2023    Caleb Exon [facility]   If 7PM-7AM, please contact night-coverage [email or web address] 08/23/2023, 12:53 AM

## 2023-08-23 ENCOUNTER — Other Ambulatory Visit: Payer: Self-pay

## 2023-08-23 DIAGNOSIS — R221 Localized swelling, mass and lump, neck: Principal | ICD-10-CM

## 2023-08-23 LAB — BASIC METABOLIC PANEL WITH GFR
Anion gap: 6 (ref 5–15)
BUN: 11 mg/dL (ref 6–20)
CO2: 23 mmol/L (ref 22–32)
Calcium: 8.9 mg/dL (ref 8.9–10.3)
Chloride: 106 mmol/L (ref 98–111)
Creatinine, Ser: 0.87 mg/dL (ref 0.61–1.24)
GFR, Estimated: >60 mL/min (ref >60)
Glucose, Bld: 174 mg/dL — ABNORMAL HIGH (ref 70–99)
Potassium: 4.3 mmol/L (ref 3.5–5.1)
Sodium: 135 mmol/L (ref 135–145)

## 2023-08-23 LAB — CBC
HCT: 41.7 % (ref 39.0–52.0)
Hemoglobin: 13.8 g/dL (ref 13.0–17.0)
MCH: 29.6 pg (ref 26.0–34.0)
MCHC: 33.1 g/dL (ref 30.0–36.0)
MCV: 89.5 fL (ref 80.0–100.0)
Platelets: 291 K/uL (ref 150–400)
RBC: 4.66 MIL/uL (ref 4.22–5.81)
RDW: 13.7 % (ref 11.5–15.5)
WBC: 7 K/uL (ref 4.0–10.5)
nRBC: 0 % (ref 0.0–0.2)

## 2023-08-23 LAB — GLUCOSE, CAPILLARY: Glucose-Capillary: 133 mg/dL — ABNORMAL HIGH (ref 70–99)

## 2023-08-23 LAB — HEPATITIS PANEL, ACUTE
HCV Ab: REACTIVE — AB
Hep A IgM: NONREACTIVE
Hep B C IgM: NONREACTIVE
Hepatitis B Surface Ag: NONREACTIVE

## 2023-08-23 MED ORDER — PHENOL 1.4 % MT LIQD
1.0000 | OROMUCOSAL | Status: DC | PRN
  Administered 2023-08-23: 1 via OROMUCOSAL
  Filled 2023-08-23: qty 177

## 2023-08-23 MED ORDER — CHLORHEXIDINE GLUCONATE CLOTH 2 % EX PADS: 6.0000 | MEDICATED_PAD | Freq: Every day | CUTANEOUS | Status: DC

## 2023-08-23 MED ORDER — ALUM & MAG HYDROXIDE-SIMETH 200-200-20 MG/5ML PO SUSP
30.0000 mL | Freq: Four times a day (QID) | ORAL | Status: DC | PRN
  Administered 2023-08-23 – 2023-08-24 (×2): 30 mL via ORAL
  Filled 2023-08-23 (×2): qty 30

## 2023-08-23 MED ORDER — ENOXAPARIN SODIUM 40 MG/0.4ML IJ SOSY
40.0000 mg | PREFILLED_SYRINGE | Freq: Every day | INTRAMUSCULAR | Status: DC
  Filled 2023-08-23: qty 0.4

## 2023-08-23 MED ORDER — ORAL CARE MOUTH RINSE: 15.0000 mL | OROMUCOSAL | Status: DC | PRN

## 2023-08-23 MED ORDER — CHLORHEXIDINE GLUCONATE CLOTH 2 % EX PADS
6.0000 | MEDICATED_PAD | Freq: Every day | CUTANEOUS | Status: DC
  Administered 2023-08-23: 6 via TOPICAL

## 2023-08-23 NOTE — Consult Note (Signed)
 Consult requested by:  Dr. Ernest  Consult requested for:  Neck swelling  Primary Physician:  Patient, No Pcp Per  History of Present Illness: 08/23/2023 Neil Parks is here today with a chief complaint of neck swelling.  He presented yesterday afternoon after having increasing neck swelling.  He had anterior cervical discectomy and fusion on July 22.  He has had a difficult living situation after his discharge.  This morning, he reports that his symptoms are improved.  We started some steroids in the emergency department yesterday and he is breathing and swallowing more easily than he was previously.  The symptoms are causing a significant impact on the patient's life.   I have utilized the care everywhere function in epic to review the outside records available from external health systems.  Review of Systems:  A 10 point review of systems is negative, except for the pertinent positives and negatives detailed in the HPI.  Past Medical History: Past Medical History:  Diagnosis Date   Cocaine abuse (HCC)    Foraminal stenosis of cervical region    Kidney stones    Marijuana use    Overweight (BMI 25.0-29.9)    Polysubstance abuse (HCC)    Tobacco abuse     Past Surgical History: Past Surgical History:  Procedure Laterality Date   ANTERIOR CERVICAL DECOMP/DISCECTOMY FUSION N/A 08/14/2023   Procedure: C5-T1 Anterior Cervical Discectomy and Fusion;  Surgeon: Claudene Penne ORN, MD;  Location: [facility] ORS;  Service: Neurosurgery;  Laterality: N/A;   FRACTURE SURGERY     HERNIA REPAIR      Allergies: Allergies as of 08/22/2023   (No Known Allergies)    Medications: Current Meds  Medication Sig   oxyCODONE  10 MG TABS Take 1 tablet (10 mg total) by mouth every 4 (four) hours as needed for up to 7 days for moderate pain (pain score 4-6) or severe pain (pain score 7-10).    Social History: Social History   Tobacco Use   Smoking status: Every Day   Smokeless  tobacco: Never  Substance Use Topics   Alcohol use: Yes    Family Medical History: History reviewed. No pertinent family history.  Physical Examination: Vitals:   08/23/23 0700 08/23/23 0800  BP: (!) 142/93 (!) 149/101  Pulse: 66 62  Resp: 15 19  Temp:  97.9 F (36.6 C)  SpO2: 96% 98%    General: Patient is in no apparent distress. Attention to examination is appropriate.  Neck:   Supple.  Full range of motion.  Respiratory: Patient is breathing without any difficulty.   NEUROLOGICAL:     Awake, alert, oriented to person, place, and time.  Speech is clear and fluent.  Cranial Nerves: Pupils equal round and reactive to light.  Facial tone is symmetric.  Facial sensation is symmetric. Shoulder shrug is symmetric. Tongue protrusion is midline.  There is no pronator drift.  Strength: Side Biceps Triceps Deltoid Interossei Grip Wrist Ext. Wrist Flex.  R 5 5 5 5 5 5 5   L 5 5 5  4+ 4+ 5 5   Side Iliopsoas Quads Hamstring PF DF EHL  R 5 5 5 5 5 5   L 5 5 5 5 5 5     Bilateral upper and lower extremity sensation is intact to light touch.    No evidence of dysmetria noted.  Gait is untested.    He has palpable fluid collection under the skin.  His trachea is midline.  He is able to  swallow water without difficulty.  Medical Decision Making  Imaging: CT Neck 08/22/2023 IMPRESSION: 1. Peripheral enhancing collection within the right aspect of the retropharynx at the level of C5-6 measuring up to 3.0 cm concerning for abscess. Associated soft tissue swelling extending laterally and exerting mass effect on the right carotid space. 2. Additional fluid noted extending along the posterior medial margin of the right sternocleidomastoid muscle extending into the overlying subcutaneous tissues which may reflect additional abscess. 3. Retropharyngeal effusion extending superiorly to the level of C2. 4. Edematous appearance of the pharyngeal mucosa, likely reactive changes. 5.  Likely reactive edema of the aryepiglottic folds with narrowing of the supraglottic airway. 6. The findings of this study were discussed with Dr. Ernest at 7:30PM on 08/22/2023. Additional clinical history obtained. Given soft tissue swelling without other clinical signs of infection, the findings may represent post operative seroma. The fluid along the sternocleidomastoid extending anteriorly in the right neck does not demonstrate convincing peripheral enhancement. The collection with the right retropharynx may demonstrate mild peripheral enhancement although evaluation in this region is limited by streak artifact from hardware. Both areas of fluid may reflect postoperative seroma.   Electronically signed by: Donnice Mania MD 08/22/2023 07:36 PM EDT RP Workstation: [id number]  CBC    Component Value Date/Time   WBC 7.0 08/23/2023 0300   RBC 4.66 08/23/2023 0300   HGB 13.8 08/23/2023 0300   HCT 41.7 08/23/2023 0300   PLT 291 08/23/2023 0300   MCV 89.5 08/23/2023 0300   MCH 29.6 08/23/2023 0300   MCHC 33.1 08/23/2023 0300   RDW 13.7 08/23/2023 0300   LYMPHSABS 1.8 08/22/2023 1518   MONOABS 0.6 08/22/2023 1518   EOSABS 0.4 08/22/2023 1518   BASOSABS 0.1 08/22/2023 1518     I have personally reviewed the images and agree with the above interpretation.  Assessment and Plan: Neil Parks is a pleasant 58 y.o. male with postoperative neck swelling and dysphagia.  This is improved with steroids.  Will continue steroids for now.  If he is stable for another 24 hours, we will de-escalate his steroids and consider discharge with outpatient oral steroids.  I will handover his care to my partner Dr. Claudene today.  I have communicated my recommendations to the requesting physician and coordinated care to facilitate these recommendations.     Sheenah Dimitroff K. Clois MD, [facility] Neurosurgery

## 2023-08-23 NOTE — Evaluation (Signed)
 Occupational Therapy Evaluation Patient Details Name: Neil Parks MRN: [id number] DOB: February 02, 1965 Today's Date: 08/23/2023   History of Present Illness   Pt is a 58 year old male presenting to the ED with with postoperative neck swelling and dysphagia; Pt is s/p C5-T1 ACDF on 08/14/23     Clinical Impressions Chart reviewed to date, pt greeted in bed, agreeable to OT evaluation. Pt is known from previous admission. Pt reports he has been bouncing around from place to place after discharge- has short term plans to head to [place]  via [place] for [place] light duty job  in the next few weeks per his report. Pt reports he did sleep in his car 1 day and it was very hot, he decided to try to ride the bus for some [facility] but It was bumpy. He reports subjectively he feels his LUE function is improved as well as his swallowing. At this time, he performs ADL with MOD I, functional mobility with CGA, progress to MOD I. Mild deficits noted in the L hand- similar to presentation prior to previous discharge. Pt will benefit from acute OT to address functional deficits and to facilitate optimal ADL/functional mobility performance.      If plan is discharge home, recommend the following:   Assist for transportation;A little help with bathing/dressing/bathroom     Functional Status Assessment   Patient has had a recent decline in their functional status and demonstrates the ability to make significant improvements in function in a reasonable and predictable amount of time.     Equipment Recommendations   None recommended by OT     Recommendations for Other Services         Precautions/Restrictions   Precautions Precautions: Cervical Precaution Booklet Issued: No Restrictions Weight Bearing Restrictions Per Provider Order: No     Mobility Bed Mobility Overal bed mobility: Modified Independent                  Transfers Overall transfer level: Needs assistance Equipment  used: None Transfers: Sit to/from Stand Sit to Stand: Modified independent (Device/Increase time), Supervision                  Balance Overall balance assessment: Needs assistance Sitting-balance support: Feet supported Sitting balance-Leahy Scale: Good     Standing balance support: No upper extremity supported Standing balance-Leahy Scale: Good                             ADL either performed or assessed with clinical judgement   ADL Overall ADL's : Needs assistance/impaired     Grooming: Set up               Lower Body Dressing: Supervision/safety   Toilet Transfer: Supervision/safety;Ambulation;Contact guard assist           Functional mobility during ADLs: Supervision/safety;Contact guard assist (approx 200' with no AD)       Vision Patient Visual Report: No change from baseline       Perception         Praxis         Pertinent Vitals/Pain Pain Assessment Pain Assessment: No/denies pain     Extremity/Trunk Assessment Upper Extremity Assessment Upper Extremity Assessment: Right hand dominant;LUE deficits/detail LUE Deficits / Details: grossly 3+ to 4/ 5 grip strength; sensation appears intact, will continue to assess LUE Coordination: decreased fine motor;decreased gross motor   Lower Extremity Assessment Lower Extremity Assessment: Overall [facility]  for tasks assessed   Cervical / Trunk Assessment Cervical / Trunk Assessment: Neck Surgery   Communication Communication Communication: No apparent difficulties   Cognition Arousal: Alert Behavior During Therapy: WFL for tasks assessed/performed Cognition: No apparent impairments             OT - Cognition Comments: ?awareness of current level of situation                 Following commands: Intact       Cueing  General Comments   Cueing Techniques: Verbal cues  spo2 >90% on RA throughout   Exercises Other Exercises Other Exercises: edu re: role of OT,  role of rehab, HEP for L Hand   Shoulder Instructions      Home Living Family/patient expects to be discharged to:: Unsure                                 Additional Comments: pt reports he has been staying with friends/cousin, he did go to a place per EMR he went to the shelter but he reports they asked him to leave; he reports he did sleep in his car 1 night; pt reports he has plans to take the train to [place]  in the near future      Prior Functioning/Environment Prior Level of Function : Independent/Modified Independent             Mobility Comments: amb with no AD ADLs Comments: generally MOD I with ADL, reports some ongoing L hand deficits impacting package/container management    OT Problem List: Decreased strength;Decreased knowledge of precautions;Decreased range of motion;Decreased coordination;Impaired UE functional use   OT Treatment/Interventions: Self-care/ADL training;Therapeutic activities;Therapeutic exercise;Neuromuscular education;Splinting;Patient/family education      OT Goals(Current goals can be found in the care plan section)   Acute Rehab OT Goals Patient Stated Goal: get out of ICU OT Goal Formulation: With patient Time For Goal Achievement: 09/06/23 Potential to Achieve Goals: Good ADL Goals Pt/caregiver will Perform Home Exercise Program: Left upper extremity;With written HEP provided   OT Frequency:  Min 2X/week    Co-evaluation PT/OT/SLP Co-Evaluation/Treatment: Yes Reason for Co-Treatment: To address functional/ADL transfers   OT goals addressed during session: ADL's and self-care      AM-PAC OT 6 Clicks Daily Activity     Outcome Measure Help from another person eating meals?: None Help from another person taking care of personal grooming?: None Help from another person toileting, which includes using toliet, bedpan, or urinal?: None Help from another person bathing (including washing, rinsing, drying)?: A  Little Help from another person to put on and taking off regular upper body clothing?: None Help from another person to put on and taking off regular lower body clothing?: None 6 Click Score: 23   End of Session Nurse Communication: Mobility status  Activity Tolerance: Patient tolerated treatment well Patient left: Other (comment) (sitting on edge of bed)  OT Visit Diagnosis: Unsteadiness on feet (R26.81);Muscle weakness (generalized) (M62.81);Other abnormalities of gait and mobility (R26.89)                Time: 9141-9090 OT Time Calculation (min): 11 min Charges:  OT General Charges $OT Visit: 1 Visit OT Evaluation $OT Eval Low Complexity: 1 Low  Therisa Sheffield, OTD OTR/L  08/23/23, 10:23 AM

## 2023-08-23 NOTE — Progress Notes (Signed)
 Attempted to call report

## 2023-08-23 NOTE — Plan of Care (Signed)

## 2023-08-23 NOTE — TOC Initial Note (Addendum)
 Transition of Care [facility]) - Initial/Assessment Note    Patient Details  Name: Neil Parks MRN: [id number] Date of Birth: Aug 10, 1965  [facility]) CM/SW Contact:    Lauraine JAYSON Carpen, LCSW Phone Number: 08/23/2023, 11:46 AM  Clinical Narrative:  CSW met with patient. No family at bedside. CSW introduced role and explained that discharge planning would be discussed. Patient is not a permanent [place] resident. He is from [place]  but is planning to move to [place]  either this Monday or the next. Patient said he recently got insurance through his job at [employer] but does not have the card yet. Workers Comp is involved due to his work-related injury. PT recommended no follow up. OT recommended outpatient OT. CSW encouraged patient to have his worker's comp case worker set up outpatient OT once he gets settled in [place] . CSW sent his workers comp information to the financial counselor:  Employer: [employer]. Claim number: [id number] Date of injury: 07/19/2023 Underwriting company: [employer]  No further concerns. CSW will continue to follow progress and facilitate discharge once medically stable. He is unsure who will pick him up at discharge.                 2:57 pm: Patient's worker's comp case worker is Arlean Molt. [phone], ext [phone]. [email or web address].  Expected Discharge Plan: Home/Self Care Barriers to Discharge: Continued Medical Work up   Patient Goals and CMS Choice            Expected Discharge Plan and Services     Post Acute Care Choice: NA Living arrangements for the past 2 months: Single Family Home                                      Prior Living Arrangements/Services Living arrangements for the past 2 months: Single Family Home   Patient language and need for interpreter reviewed:: Yes Do you feel safe going back to the place where you live?: Yes      Need for Family Participation in Patient  Care: Yes (Comment)     Criminal Activity/Legal Involvement Pertinent to Current Situation/Hospitalization: No - Comment as needed  Activities of Daily Living      Permission Sought/Granted                  Emotional Assessment Appearance:: Appears stated age Attitude/Demeanor/Rapport: Engaged, Gracious Affect (typically observed): Accepting, Appropriate, Calm, Pleasant Orientation: : Oriented to Self, Oriented to Place, Oriented to  Time, Oriented to Situation Alcohol / Substance Use: Not Applicable Psych Involvement: No (comment)  Admission diagnosis:  Neck swelling [R22.1] Neural foraminal stenosis of cervical spine [M48.02] Patient Active Problem List   Diagnosis Date Noted   Neck swelling 08/23/2023   Abnormal liver function 08/22/2023   Tobacco abuse    Polysubstance abuse (HCC)    Cocaine abuse (HCC)    Overweight (BMI 25.0-29.9)    Radiculopathy affecting upper extremity 08/13/2023   Left hand weakness 08/13/2023   Foraminal stenosis of cervical region 08/13/2023   PCP:  Patient, No Pcp Per Pharmacy:   [id number] [address], [place] - [address] [place] [address] Phone: [phone] Fax: [phone]     Social Drivers of Health (SDOH) Social History: SDOH Screenings   Food Insecurity: No Food Insecurity (08/13/2023)  Housing: Low Risk  (08/13/2023)  Transportation Needs: No Transportation  Needs (08/13/2023)  Utilities: Not At Risk (08/13/2023)  Tobacco Use: High Risk (08/22/2023)   SDOH Interventions:     Readmission Risk Interventions     No data to display

## 2023-08-23 NOTE — Progress Notes (Signed)
 Patient transferred. AxOx4. VSS. All patient belongings kept at bedside transferred with patient.

## 2023-08-23 NOTE — Progress Notes (Signed)
 Progress Note    Neil Parks  FMW:[id number] DOB: [birth year]  DOA: 08/22/2023 PCP: Patient, No Pcp Per      Brief Narrative:    Medical records reviewed and are as summarized below:  Neil Parks is a 58 y.o. male  with medical history significant of foraminal stenosis of cervical spine, radiculopathy affecting upper extremity with left hand weakness, polysubstance use disorder (cocaine, marijuana, tobacco) who presented to the hospital with neck pain, painful swallowing and difficulty swallowing.  Patient was recently hospitalized from 7/20 - 7/24.  He is s/p C5-T1 anterior cervical discectomy & fusion by Dr. Claudene of neurosurgery on 08/14/23.  He had been taking oxycodone  for 5 days as prescribed but he ran out of pain medications.  He continues to have pain at the anterior neck incisional site and noticed swelling in the anterior neck area.  He had pain and difficulty swallowing so he had to cut up his food into smaller pieces.  He also reported he has poor living situation and not been staying in his car.   CT soft tissue neck with contrast:   IMPRESSION: 1. Peripheral enhancing collection within the right aspect of the retropharynx at the level of C5-6 measuring up to 3.0 cm concerning for abscess. Associated soft tissue swelling extending laterally and exerting mass effect on the right carotid space. 2. Additional fluid noted extending along the posterior medial margin of the right sternocleidomastoid muscle extending into the overlying subcutaneous tissues which may reflect additional abscess. 3. Retropharyngeal effusion extending superiorly to the level of C2. 4. Edematous appearance of the pharyngeal mucosa, likely reactive changes. 5. Likely reactive edema of the aryepiglottic folds with narrowing of the supraglottic airway. 6. The findings of this study were discussed with Dr. Ernest at 7:30PM on 08/22/2023. Additional clinical history obtained. Given soft  tissue swelling without other clinical signs of infection, the findings may represent post operative seroma. The fluid along the sternocleidomastoid extending anteriorly in the right neck does not demonstrate convincing peripheral enhancement. The collection with the right retropharynx may demonstrate mild peripheral enhancement although evaluation in this region is limited by streak artifact from hardware. Both areas of fluid may reflect postoperative seroma.       Assessment/Plan:   Principal Problem:   Foraminal stenosis of cervical region Active Problems:   Radiculopathy affecting upper extremity   Left hand weakness   Abnormal liver function   Tobacco abuse   Polysubstance abuse (HCC)   Cocaine abuse (HCC)   Overweight (BMI 25.0-29.9)   Neck swelling     Body mass index is 22.42 kg/m.   Foraminal stenosis of cervical spine and radiculopathy affecting upper extremity, left hand weakness:   He is s/p C5-T1 anterior cervical discectomy & fusion by Dr. Claudene of neurosurgery on 08/14/23. PT evaluation   Postoperative neck swelling and dysphagia: Improved with analgesics and IV steroids. He was able to eat breakfast this morning without any difficulty. Discontinue IV fluids.  Continue IV dexamethasone  Analgesics as needed.  Appreciate input from neurosurgeon.   Elevated liver enzymes: Hepatitis panel is pending.  Obtain liver ultrasound for further evaluation   Polysubstance use disorder (cocaine, marijuana, tobacco): Counseled to quit    Homelessness, poor living situation:  Consult TOC to assist with placement   Transfer patient from stepdown unit to MedSurg unit.  Diet Order             Diet regular Room service appropriate? Yes; Fluid consistency: Thin  Diet effective now                            Consultants: Neurosurgeon  Procedures: None    Medications:    Chlorhexidine  Gluconate Cloth  6 each Topical Daily    dexamethasone  (DECADRON ) injection  10 mg Intravenous Q6H   nicotine   21 mg Transdermal Daily   Continuous Infusions:  sodium chloride  75 mL/hr at 08/23/23 9147     Anti-infectives (From admission, onward)    None              Family Communication/Anticipated D/C date and plan/Code Status   DVT prophylaxis: SCDs Start: 08/22/23 2023     Code Status: Full Code  Family Communication: None Disposition Plan: Plan to discharge home   Status is: Inpatient Remains inpatient appropriate because: Postoperative neck swelling and pain       Subjective:   Interval events noted.  He feels better today.  He was eating breakfast when I walked in.  He said he was able to swallow without any difficulty.  He still has some pain and swelling at the anterior aspect of the neck but symptoms are improving.     Objective:    Vitals:   08/23/23 0600 08/23/23 0700 08/23/23 0800 08/23/23 0900  BP: (!) 151/92 (!) 142/93 (!) 149/101 (!) 156/96  Pulse: 62 66 62 79  Resp: 13 15 19 16   Temp:   97.9 F (36.6 C)   TempSrc:   Oral   SpO2: 99% 96% 98% 99%  Weight:      Height:       No data found.   Intake/Output Summary (Last 24 hours) at 08/23/2023 0927 Last data filed at 08/23/2023 0700 Gross per 24 hour  Intake 758.75 ml  Output --  Net 758.75 ml   Filed Weights   08/22/23 1516 08/22/23 2150  Weight: 83.9 kg 75 kg    Exam:   GEN: NAD SKIN: Warm and dry EYES: No pallor or icterus ENT: No swelling, exudates or erythema noted in oropharynx, MMM, anterior neck swelling improving CV: RRR PULM: CTA B ABD: soft, ND, NT, +BS CNS: AAO x 3, non focal EXT: No edema or tenderness      Data Reviewed:   I have personally reviewed following labs and imaging studies:  Labs: Labs show the following:   Basic Metabolic Panel: Recent Labs  Lab 08/22/23 1518 08/23/23 0300  NA 136 135  K 3.6 4.3  CL 103 106  CO2 23 23  GLUCOSE 155* 174*  BUN 10 11  CREATININE  0.88 0.87  CALCIUM 8.7* 8.9   GFR Estimated Creatinine Clearance: 101.8 mL/min (by C-G formula based on SCr of 0.87 mg/dL). Liver Function Tests: Recent Labs  Lab 08/22/23 1518  AST 81*  ALT 59*  ALKPHOS 121  BILITOT 0.7  PROT 7.1  ALBUMIN 3.2*   No results for input(s): LIPASE, AMYLASE in the last 168 hours. No results for input(s): AMMONIA in the last 168 hours. Coagulation profile Recent Labs  Lab 08/22/23 2055  INR 1.0    CBC: Recent Labs  Lab 08/22/23 1518 08/23/23 0300  WBC 7.9 7.0  NEUTROABS 5.1  --   HGB 14.3 13.8  HCT 41.7 41.7  MCV 88.5 89.5  PLT 319 291   Cardiac Enzymes: No results for input(s): CKTOTAL, CKMB, CKMBINDEX, TROPONINI in the last 168 hours. BNP (last 3 results) No results for input(s): PROBNP in  the last 8760 hours. CBG: Recent Labs  Lab 08/22/23 2158 08/23/23 0738  GLUCAP 115* 133*   D-Dimer: No results for input(s): DDIMER in the last 72 hours. Hgb A1c: No results for input(s): HGBA1C in the last 72 hours. Lipid Profile: No results for input(s): CHOL, HDL, LDLCALC, TRIG, CHOLHDL, LDLDIRECT in the last 72 hours. Thyroid function studies: No results for input(s): TSH, T4TOTAL, T3FREE, THYROIDAB in the last 72 hours.  Invalid input(s): FREET3 Anemia work up: No results for input(s): VITAMINB12, FOLATE, FERRITIN, TIBC, IRON, RETICCTPCT in the last 72 hours. Sepsis Labs: Recent Labs  Lab 08/22/23 1518 08/23/23 0300  WBC 7.9 7.0    Microbiology No results found for this or any previous visit (from the past 240 hours).  Procedures and diagnostic studies:  DG Cervical Spine 2 or 3 views Result Date: 08/22/2023 CLINICAL DATA:  Swelling. Surgery last Thursday requiring incision to the front of the throat. Throat swells up with difficulty breathing. EXAM: CERVICAL SPINE - 2-3 VIEW COMPARISON:  08/13/2023 FINDINGS: Interval postoperative changes with anterior plate and screw  fixation and intervertebral fusion from C5 through T1. Surgical hardware appears intact. Normal alignment of the cervical spine. No vertebral compression deformities. There is prominent increased prevertebral soft tissue swelling extending from about C3 through T1. This could represent postoperative change or inflammatory/infectious process. No soft tissue gas or radiopaque foreign bodies are demonstrated. Cervical airway appears patent. IMPRESSION: 1. Postoperative anterior fixation with intervertebral fusion from C5 through T1. 2. Prominent prevertebral soft tissue swelling. Electronically Signed   By: Elsie Gravely M.D.   On: 08/22/2023 20:49   CT Soft Tissue Neck W Contrast Addendum Date: 08/22/2023 ADDENDUM #2 EXAM: CT NECK WITH CONTRAST 08/22/2023 05:58:36 PM TECHNIQUE: CT of the neck was performed with the administration of intravenous contrast. Multiplanar reformatted images are provided for review. Automated exposure control, iterative reconstruction, and/or weight based adjustment of the mA/kV was utilized to reduce the radiation dose to as low as reasonably achievable. COMPARISON: CT of the cervical spine dated 08/13/2023. CLINICAL HISTORY: Brachial plexopathy, nontraumatic. Patient to ED via POV for post-op problem. PT reports having surgery last Tuesday that required incision on front of throat. Pt reports when hot that the throat swells up making it hard to breathe. Denies any breathing difficulties at this time. NAD ; noted. States he was also prescribed pain meds for 7 days but only had enough for 5 days and now is out. FINDINGS: AERODIGESTIVE TRACT: Edematous appearance of the pharyngeal mucosa likely reflecting reactive changes. Normal appearance of the epiglottis. There is also likely reactive edema of the aryepiglottic folds with narrowing of the supraglottic airway seen on series 2 image 82. SALIVARY GLANDS: The parotid and submandibular glands are unremarkable. THYROID: Unremarkable.  LYMPH NODES: No enlarged lymph nodes within the neck. SOFT TISSUES: Interval postsurgical changes of ACDF spanning C5 to T1. Hardware is intact. There is a peripheral enhancing collection within the right aspect of the retropharynx centered around the level of C5-6 which measures approximately 3.0 x 1.0 x 2.8 cm. There is associated soft tissue swelling within the retropharynx extending laterally and exerting mass effect on the right carotid space. Additional retropharyngeal effusion extending superiorly to the level of C2 without definite peripheral enhancement. Soft tissue swelling extends anteriorly within the right aspect of the neck into the soft tissues adjacent to the sternocleidomastoid muscle likely reflecting postsurgical changes. BRAIN, ORBITS, SINUSES AND MASTOIDS: Mild mucosal thickening in the ethmoid sinuses, right sphenoid sinus, and left maxillary  sinus. No air-fluid levels. Chronic-appearing deformity of the right lamina papyracea. LUNGS AND MEDIASTINUM: No acute abnormality. BONES: Multiple dental caries noted. IMPRESSION: 1. Peripheral enhancing collection within the right aspect of the retropharynx at the level of C5-6 measuring up to 3.0 cm concerning for abscess. Associated soft tissue swelling extending laterally and exerting mass effect on the right carotid space. 2. Additional fluid noted extending along the posterior medial margin of the right sternocleidomastoid muscle extending into the overlying subcutaneous tissues which may reflect additional abscess. 3. Retropharyngeal effusion extending superiorly to the level of C2. 4. Edematous appearance of the pharyngeal mucosa, likely reactive changes. 5. Likely reactive edema of the aryepiglottic folds with narrowing of the supraglottic airway. 6. The findings of this study were discussed with Dr. Ernest at 7:30PM on 08/22/2023. Additional clinical history obtained. Given soft tissue swelling without other clinical signs of infection, the  findings may represent post operative seroma. The fluid along the sternocleidomastoid extending anteriorly in the right neck does not demonstrate convincing peripheral enhancement. The collection with the right retropharynx may demonstrate mild peripheral enhancement although evaluation in this region is limited by streak artifact from hardware. Both areas of fluid may reflect postoperative seroma. Electronically signed by: Donnice Mania MD 08/22/2023 07:36 PM EDT RP Workstation: [id number]   Addendum Date: 08/22/2023  ADDENDUM #1  EXAM: CT NECK WITH CONTRAST 08/22/2023 05:58:36 PM TECHNIQUE: CT of the neck was performed with the administration of intravenous contrast. Multiplanar reformatted images are provided for review. Automated exposure control, iterative reconstruction, and/or weight based adjustment of the mA/kV was utilized to reduce the radiation dose to as low as reasonably achievable. COMPARISON: CT of the cervical spine dated 08/13/2023. CLINICAL HISTORY: Brachial plexopathy, nontraumatic. Patient to ED via POV for post-op problem. PT reports having surgery last Tuesday that required incision on front of throat. Pt reports when hot that the throat swells up making it hard to breathe. Denies any breathing difficulties at this time. NAD ; noted. States he was also prescribed pain meds for 7 days but only had enough for 5 days and now is out. FINDINGS: AERODIGESTIVE TRACT: Edematous appearance of the pharyngeal mucosa likely reflecting reactive changes. Normal appearance of the epiglottis. There is also likely reactive edema of the aryepiglottic folds with narrowing of the supraglottic airway seen on series 2 image 82. SALIVARY GLANDS: The parotid and submandibular glands are unremarkable. THYROID: Unremarkable. LYMPH NODES: No enlarged lymph nodes within the neck. SOFT TISSUES: Interval postsurgical changes of ACDF spanning C5 to T1. Hardware is intact. There is a peripheral enhancing collection within  the right aspect of the retropharynx centered around the level of C5-6 which measures approximately 3.0 x 1.0 x 2.8 cm. There is associated soft tissue swelling within the retropharynx extending laterally and exerting mass effect on the right carotid space. Additional retropharyngeal effusion extending superiorly to the level of C2 without definite peripheral enhancement. Soft tissue swelling extends anteriorly within the right aspect of the neck into the soft tissues adjacent to the sternocleidomastoid muscle likely reflecting postsurgical changes. BRAIN, ORBITS, SINUSES AND MASTOIDS: Mild mucosal thickening in the ethmoid sinuses, right sphenoid sinus, and left maxillary sinus. No air-fluid levels. Chronic-appearing deformity of the right lamina papyracea. LUNGS AND MEDIASTINUM: No acute abnormality. BONES: Multiple dental caries noted. IMPRESSION: 1. Peripheral enhancing collection within the right aspect of the retropharynx at the level of C5-6 measuring up to 3.0 cm concerning for abscess. Associated soft tissue swelling extending laterally and exerting mass effect on the  right carotid space. 2. Additional fluid noted extending along the posterior medial margin of the right sternocleidomastoid muscle extending into the overlying subcutaneous tissues which may reflect additional abscess. 3. Retropharyngeal effusion extending superiorly to the level of C2. 4. Edematous appearance of the pharyngeal mucosa, likely reactive changes. 5. Likely reactive edema of the aryepiglottic folds with narrowing of the supraglottic airway. Electronically signed by: Donnice Mania MD 08/22/2023 07:33 PM EDT RP Workstation: [id number]   Result Date: 08/22/2023  ORIGINAL REPORT EXAM: CT NECK WITH CONTRAST 08/22/2023 05:58:36 PM TECHNIQUE: CT of the neck was performed with the administration of intravenous contrast. Multiplanar reformatted images are provided for review. Automated exposure control, iterative reconstruction, and/or  weight based adjustment of the mA/kV was utilized to reduce the radiation dose to as low as reasonably achievable. COMPARISON: CT of the cervical spine dated 08/13/2023. CLINICAL HISTORY: Brachial plexopathy, nontraumatic. Patient to ED via POV for post-op problem. PT reports having surgery last Tuesday that required incision on front of throat. Pt reports when hot that the throat swells up making it hard to breathe. Denies any breathing difficulties at this time. NAD ; noted. States he was also prescribed pain meds for 7 days but only had enough for 5 days and now is out. FINDINGS: AERODIGESTIVE TRACT: Edematous appearance of the pharyngeal mucosa likely reflecting reactive changes. Normal appearance of the epiglottis. There is also likely reactive edema of the aryepiglottic folds with narrowing of the supraglottic airway seen on series 2 image 82. SALIVARY GLANDS: The parotid and submandibular glands are unremarkable. THYROID: Unremarkable. LYMPH NODES: No enlarged lymph nodes within the neck. SOFT TISSUES: Interval postsurgical changes of ACDF spanning C5 to T1. Hardware is intact. There is a peripheral enhancing collection within the right aspect of the retropharynx centered around the level of C5-6 which measures approximately 3.0 x 1.0 x 2.8 cm. There is associated soft tissue swelling within the retropharynx extending laterally and exerting mass effect on the right carotid space. Additional retropharyngeal effusion extending superiorly to the level of C2 without definite peripheral enhancement. Soft tissue swelling extends anteriorly within the right aspect of the neck into the soft tissues adjacent to the sternocleidomastoid muscle likely reflecting postsurgical changes. BRAIN, ORBITS, SINUSES AND MASTOIDS: Mild mucosal thickening in the ethmoid sinuses, right sphenoid sinus, and left maxillary sinus. No air-fluid levels. Chronic-appearing deformity of the right lamina papyracea. LUNGS AND MEDIASTINUM: No  acute abnormality. BONES: Multiple dental caries noted. IMPRESSION: 1. Peripheral enhancing collection within the right aspect of the retropharynx at the level of C5-6 measuring up to 3.0 cm concerning for abscess. Associated soft tissue swelling extending laterally and exerting mass effect on the right carotid space. 2. Additional fluid noted extending along the posterior medial margin of the right sternocleidomastoid muscle extending into the overlying subcutaneous tissues which may reflect additional abscess. 3. Retropharyngeal effusion extending superiorly to the level of C2. 4. Edematous appearance of the pharyngeal mucosa, likely reactive changes. 5. Likely reactive edema of the aryepiglottic folds with narrowing of the supraglottic airway. Electronically signed by: Donnice Mania MD 08/22/2023 07:16 PM EDT RP Workstation: [id number]               LOS: 1 day   Tucker Minter  [facility]   Pager on [email or web address]. If 7PM-7AM, please contact night-coverage at [email or web address]     08/23/2023, 9:27 AM

## 2023-08-23 NOTE — Evaluation (Addendum)
 Physical Therapy Evaluation and Discharge  Patient Details Name: Neil Parks MRN: [id number] DOB: 19-May-1965 Today's Date: 08/23/2023  History of Present Illness  Pt is a 58 year old male presenting to the ED with with postoperative neck swelling and dysphagia; Pt is s/p C5-T1 ACDF on 08/14/23  Clinical Impression  Patient is agreeable to PT evaluation. He reports he is unsure where he will go at discharge.  Today the patient required no physical assistance with mobility. He walked the unit without assistive device and no loss of balance. He reports pain is well controlled. Reinforced general spine precautions for safety. No anticipated PT needs after this hospital stay. PT will sign off at this time.       If plan is discharge home, recommend the following: Assist for transportation   Can travel by private vehicle        Equipment Recommendations None recommended by PT  Recommendations for Other Services       Functional Status Assessment Patient has not had a recent decline in their functional status     Precautions / Restrictions Precautions Precautions: Cervical Precaution Booklet Issued: No Restrictions Weight Bearing Restrictions Per Provider Order: No      Mobility  Bed Mobility Overal bed mobility: Modified Independent                  Transfers Overall transfer level: Needs assistance Equipment used: None Transfers: Sit to/from Stand Sit to Stand: Modified independent (Device/Increase time)           General transfer comment: cues for safety    Ambulation/Gait Ambulation/Gait assistance: Supervision, Modified independent (Device/Increase time) Gait Distance (Feet): 220 Feet Assistive device: None Gait Pattern/deviations: Step-through pattern, Decreased stance time - left Gait velocity: decreased     General Gait Details: patient ambulated around the unit without loss of balance. supervision for safety due to lines management  Stairs             Wheelchair Mobility     Tilt Bed    Modified Rankin (Stroke Patients Only)       Balance Overall balance assessment: Needs assistance Sitting-balance support: Feet supported Sitting balance-Leahy Scale: Good     Standing balance support: No upper extremity supported Standing balance-Leahy Scale: Good                               Pertinent Vitals/Pain Pain Assessment Pain Assessment: No/denies pain    Home Living Family/patient expects to be discharged to:: Unsure                   Additional Comments: pt reports he has been staying with friends/cousin, he did go to a place per EMR he went to the shelter but he reports they asked him to leave; he reports he did sleep in his car 1 night; pt reports he has plans to take the train to [place]  in the near future    Prior Function Prior Level of Function : Independent/Modified Independent             Mobility Comments: amb with no AD ADLs Comments: generally MOD I with ADL, reports some ongoing L hand deficits impacting package/container management     Extremity/Trunk Assessment   Upper Extremity Assessment Upper Extremity Assessment: Defer to OT evaluation (patient reports L arm/hand weakness) LUE Deficits / Details: grossly 3+ to 4/ 5 grip strength; sensation appears intact, will continue to  assess LUE Coordination: decreased fine motor;decreased gross motor    Lower Extremity Assessment Lower Extremity Assessment: Overall WFL for tasks assessed    Cervical / Trunk Assessment Cervical / Trunk Assessment: Neck Surgery  Communication   Communication Communication: No apparent difficulties    Cognition Arousal: Alert Behavior During Therapy: WFL for tasks assessed/performed   PT - Cognitive impairments: Safety/Judgement                       PT - Cognition Comments: patient is midly impulsive at times with mobility Following commands: Intact        Cueing Cueing Techniques: Verbal cues     General Comments General comments (skin integrity, edema, etc.): reinforced general spine precuations and importance to maintain with functional activity.    Exercises     Assessment/Plan    PT Assessment Patient does not need any further PT services  PT Problem List         PT Treatment Interventions      PT Goals (Current goals can be found in the Care Plan section)  Acute Rehab PT Goals PT Goal Formulation: All assessment and education complete, DC therapy    Frequency       Co-evaluation PT/OT/SLP Co-Evaluation/Treatment: Yes Reason for Co-Treatment: To address functional/ADL transfers PT goals addressed during session: Mobility/safety with mobility OT goals addressed during session: ADL's and self-care       AM-PAC PT 6 Clicks Mobility  Outcome Measure Help needed turning from your back to your side while in a flat bed without using bedrails?: None Help needed moving from lying on your back to sitting on the side of a flat bed without using bedrails?: None Help needed moving to and from a bed to a chair (including a wheelchair)?: None Help needed standing up from a chair using your arms (e.g., wheelchair or bedside chair)?: None Help needed to walk in hospital room?: None Help needed climbing 3-5 steps with a railing? : None 6 Click Score: 24    End of Session   Activity Tolerance: Patient tolerated treatment well Patient left:  (seated on side of bed per patient preference) Nurse Communication: Mobility status PT Visit Diagnosis: Muscle weakness (generalized) (M62.81)    Time: 9141-9089 PT Time Calculation (min) (ACUTE ONLY): 12 min   Charges:   PT Evaluation $PT Eval Low Complexity: 1 Low   PT General Charges $$ ACUTE PT VISIT: 1 Visit         Randine Essex, PT, MPT   Randine LULLA Essex 08/23/2023, 11:45 AM

## 2023-08-24 ENCOUNTER — Telehealth: Payer: Self-pay | Admitting: Neurosurgery

## 2023-08-24 DIAGNOSIS — R768 Other specified abnormal immunological findings in serum: Secondary | ICD-10-CM | POA: Diagnosis present

## 2023-08-24 LAB — GLUCOSE, CAPILLARY: Glucose-Capillary: 150 mg/dL — ABNORMAL HIGH (ref 70–99)

## 2023-08-24 MED ORDER — METHYLPREDNISOLONE 4 MG PO TBPK
ORAL_TABLET | ORAL | 0 refills | Status: DC
Start: 2023-08-24 — End: 2023-09-26

## 2023-08-24 MED ORDER — OXYCODONE HCL 5 MG PO TABS: 5.0000 mg | ORAL_TABLET | Freq: Three times a day (TID) | ORAL | 0 refills | Status: DC | PRN

## 2023-08-24 NOTE — Progress Notes (Signed)
 Progress Note    Neil Parks  FMW:[id number] DOB: [birth year]  DOA: 08/22/2023 PCP: Patient, No Pcp Per      Brief Narrative:    Medical records reviewed and are as summarized below:  Neil Parks is a 58 y.o. male  with medical history significant of foraminal stenosis of cervical spine, radiculopathy affecting upper extremity with left hand weakness, polysubstance use disorder (cocaine, marijuana, tobacco) who presented to the hospital with neck pain, painful swallowing and difficulty swallowing.  Patient was recently hospitalized from 7/20 - 7/24.  He is s/p C5-T1 anterior cervical discectomy & fusion by Dr. Claudene of neurosurgery on 08/14/23.  He had been taking oxycodone  for 5 days as prescribed but he ran out of pain medications.  He continues to have pain at the anterior neck incisional site and noticed swelling in the anterior neck area.  He had pain and difficulty swallowing so he had to cut up his food into smaller pieces.  He also reported he has poor living situation and not been staying in his car.   CT soft tissue neck with contrast:   IMPRESSION: 1. Peripheral enhancing collection within the right aspect of the retropharynx at the level of C5-6 measuring up to 3.0 cm concerning for abscess. Associated soft tissue swelling extending laterally and exerting mass effect on the right carotid space. 2. Additional fluid noted extending along the posterior medial margin of the right sternocleidomastoid muscle extending into the overlying subcutaneous tissues which may reflect additional abscess. 3. Retropharyngeal effusion extending superiorly to the level of C2. 4. Edematous appearance of the pharyngeal mucosa, likely reactive changes. 5. Likely reactive edema of the aryepiglottic folds with narrowing of the supraglottic airway. 6. The findings of this study were discussed with Dr. Ernest at 7:30PM on 08/22/2023. Additional clinical history obtained. Given soft  tissue swelling without other clinical signs of infection, the findings may represent post operative seroma. The fluid along the sternocleidomastoid extending anteriorly in the right neck does not demonstrate convincing peripheral enhancement. The collection with the right retropharynx may demonstrate mild peripheral enhancement although evaluation in this region is limited by streak artifact from hardware. Both areas of fluid may reflect postoperative seroma.       Assessment/Plan:   Principal Problem:   Foraminal stenosis of cervical region Active Problems:   Radiculopathy affecting upper extremity   Left hand weakness   Abnormal liver function   Tobacco abuse   Polysubstance abuse (HCC)   Cocaine abuse (HCC)   Overweight (BMI 25.0-29.9)   Neck swelling   Hepatitis C antibody positive     Body mass index is 22.42 kg/m.    Foraminal stenosis of cervical spine and radiculopathy affecting upper extremity, left hand weakness:   He is s/p C5-T1 anterior cervical discectomy & fusion by Dr. Claudene of neurosurgery on 08/14/23. Was seen by PT.  No PT follow-up required.     Postoperative neck swelling and dysphagia: Improved with analgesics and IV steroids. He is still on IV dexamethasone . Case discussed with Dr. Claudene, neurosurgeon, via secure chat. He will see the patient later today for final recommendations at discharge.      Elevated liver enzymes, positive hepatitis C antibody: Hepatitis C antibody positive but hepatitis C IgM nonreactive Patient advised to follow-up with a gastroenterologist for further evaluation.  He said he will be traveling to [place]  and will follow-up when he gets there.     Polysubstance use disorder (cocaine, marijuana, tobacco): Counseled to  quit       Homelessness, poor living situation:  He said he is traveling to [place]  to recuperate.     Diet Order             Diet regular Room service appropriate? Yes; Fluid  consistency: Thin  Diet effective now                            Consultants: Neurosurgeon  Procedures: None    Medications:    Chlorhexidine  Gluconate Cloth  6 each Topical Daily   dexamethasone  (DECADRON ) injection  10 mg Intravenous Q6H   enoxaparin  (LOVENOX ) injection  40 mg Subcutaneous QHS   nicotine   21 mg Transdermal Daily   Continuous Infusions:     Anti-infectives (From admission, onward)    None              Family Communication/Anticipated D/C date and plan/Code Status   DVT prophylaxis: enoxaparin  (LOVENOX ) injection 40 mg Start: 08/23/23 2200 SCDs Start: 08/22/23 2023     Code Status: Full Code  Family Communication: None Disposition Plan: Plan to discharge home   Status is: Inpatient Remains inpatient appropriate because: Postoperative neck swelling and pain       Subjective:   Interval events noted.  He has no complaints.  He feels much better and is ready to go home.  He said the swelling is gone and he is eating well without any issues.  He requested some oxycodone  pills as needed for pain.  He has been ambulating in his room.  Objective:    Vitals:   08/23/23 1953 08/24/23 0355 08/24/23 0925 08/24/23 1519  BP: (!) 151/84 131/71 (!) 140/75 134/85  Pulse: 84 74 71 81  Resp: 16 16 18 18   Temp: 97.6 F (36.4 C) 98.5 F (36.9 C) 97.8 F (36.6 C) 98 F (36.7 C)  TempSrc:      SpO2: 96% 97% 96% 99%  Weight:      Height:       No data found.   Intake/Output Summary (Last 24 hours) at 08/24/2023 1531 Last data filed at 08/24/2023 1419 Gross per 24 hour  Intake 0 ml  Output --  Net 0 ml   Filed Weights   08/22/23 1516 08/22/23 2150  Weight: 83.9 kg 75 kg    Exam:    GEN: NAD SKIN: Warm and dry EYES: No pallor or icterus ENT: MMM, anterior neck incision wound is healing nicely, clean, dry and intact.  Anterior neck swelling has resolved CV: RRR PULM: CTA B ABD: soft, ND, NT, +BS CNS: AAO x 3,  non focal EXT: No edema or tenderness      Data Reviewed:   I have personally reviewed following labs and imaging studies:  Labs: Labs show the following:   Basic Metabolic Panel: Recent Labs  Lab 08/22/23 1518 08/23/23 0300  NA 136 135  K 3.6 4.3  CL 103 106  CO2 23 23  GLUCOSE 155* 174*  BUN 10 11  CREATININE 0.88 0.87  CALCIUM 8.7* 8.9   GFR Estimated Creatinine Clearance: 101.8 mL/min (by C-G formula based on SCr of 0.87 mg/dL). Liver Function Tests: Recent Labs  Lab 08/22/23 1518  AST 81*  ALT 59*  ALKPHOS 121  BILITOT 0.7  PROT 7.1  ALBUMIN 3.2*   No results for input(s): LIPASE, AMYLASE in the last 168 hours. No results for input(s): AMMONIA in the last 168 hours. Coagulation profile  Recent Labs  Lab 08/22/23 2055  INR 1.0    CBC: Recent Labs  Lab 08/22/23 1518 08/23/23 0300  WBC 7.9 7.0  NEUTROABS 5.1  --   HGB 14.3 13.8  HCT 41.7 41.7  MCV 88.5 89.5  PLT 319 291   Cardiac Enzymes: No results for input(s): CKTOTAL, CKMB, CKMBINDEX, TROPONINI in the last 168 hours. BNP (last 3 results) No results for input(s): PROBNP in the last 8760 hours. CBG: Recent Labs  Lab 08/22/23 2158 08/23/23 0738 08/24/23 0931  GLUCAP 115* 133* 150*   D-Dimer: No results for input(s): DDIMER in the last 72 hours. Hgb A1c: No results for input(s): HGBA1C in the last 72 hours. Lipid Profile: No results for input(s): CHOL, HDL, LDLCALC, TRIG, CHOLHDL, LDLDIRECT in the last 72 hours. Thyroid function studies: No results for input(s): TSH, T4TOTAL, T3FREE, THYROIDAB in the last 72 hours.  Invalid input(s): FREET3 Anemia work up: No results for input(s): VITAMINB12, FOLATE, FERRITIN, TIBC, IRON, RETICCTPCT in the last 72 hours. Sepsis Labs: Recent Labs  Lab 08/22/23 1518 08/23/23 0300  WBC 7.9 7.0    Microbiology No results found for this or any previous visit (from the past 240  hours).  Procedures and diagnostic studies:  DG Cervical Spine 2 or 3 views Result Date: 08/22/2023 CLINICAL DATA:  Swelling. Surgery last Thursday requiring incision to the front of the throat. Throat swells up with difficulty breathing. EXAM: CERVICAL SPINE - 2-3 VIEW COMPARISON:  08/13/2023 FINDINGS: Interval postoperative changes with anterior plate and screw fixation and intervertebral fusion from C5 through T1. Surgical hardware appears intact. Normal alignment of the cervical spine. No vertebral compression deformities. There is prominent increased prevertebral soft tissue swelling extending from about C3 through T1. This could represent postoperative change or inflammatory/infectious process. No soft tissue gas or radiopaque foreign bodies are demonstrated. Cervical airway appears patent. IMPRESSION: 1. Postoperative anterior fixation with intervertebral fusion from C5 through T1. 2. Prominent prevertebral soft tissue swelling. Electronically Signed   By: Elsie Gravely M.D.   On: 08/22/2023 20:49   CT Soft Tissue Neck W Contrast Addendum Date: 08/22/2023 ADDENDUM #2 EXAM: CT NECK WITH CONTRAST 08/22/2023 05:58:36 PM TECHNIQUE: CT of the neck was performed with the administration of intravenous contrast. Multiplanar reformatted images are provided for review. Automated exposure control, iterative reconstruction, and/or weight based adjustment of the mA/kV was utilized to reduce the radiation dose to as low as reasonably achievable. COMPARISON: CT of the cervical spine dated 08/13/2023. CLINICAL HISTORY: Brachial plexopathy, nontraumatic. Patient to ED via POV for post-op problem. PT reports having surgery last Tuesday that required incision on front of throat. Pt reports when hot that the throat swells up making it hard to breathe. Denies any breathing difficulties at this time. NAD ; noted. States he was also prescribed pain meds for 7 days but only had enough for 5 days and now is out. FINDINGS:  AERODIGESTIVE TRACT: Edematous appearance of the pharyngeal mucosa likely reflecting reactive changes. Normal appearance of the epiglottis. There is also likely reactive edema of the aryepiglottic folds with narrowing of the supraglottic airway seen on series 2 image 82. SALIVARY GLANDS: The parotid and submandibular glands are unremarkable. THYROID: Unremarkable. LYMPH NODES: No enlarged lymph nodes within the neck. SOFT TISSUES: Interval postsurgical changes of ACDF spanning C5 to T1. Hardware is intact. There is a peripheral enhancing collection within the right aspect of the retropharynx centered around the level of C5-6 which measures approximately 3.0 x 1.0 x 2.8 cm.  There is associated soft tissue swelling within the retropharynx extending laterally and exerting mass effect on the right carotid space. Additional retropharyngeal effusion extending superiorly to the level of C2 without definite peripheral enhancement. Soft tissue swelling extends anteriorly within the right aspect of the neck into the soft tissues adjacent to the sternocleidomastoid muscle likely reflecting postsurgical changes. BRAIN, ORBITS, SINUSES AND MASTOIDS: Mild mucosal thickening in the ethmoid sinuses, right sphenoid sinus, and left maxillary sinus. No air-fluid levels. Chronic-appearing deformity of the right lamina papyracea. LUNGS AND MEDIASTINUM: No acute abnormality. BONES: Multiple dental caries noted. IMPRESSION: 1. Peripheral enhancing collection within the right aspect of the retropharynx at the level of C5-6 measuring up to 3.0 cm concerning for abscess. Associated soft tissue swelling extending laterally and exerting mass effect on the right carotid space. 2. Additional fluid noted extending along the posterior medial margin of the right sternocleidomastoid muscle extending into the overlying subcutaneous tissues which may reflect additional abscess. 3. Retropharyngeal effusion extending superiorly to the level of C2. 4.  Edematous appearance of the pharyngeal mucosa, likely reactive changes. 5. Likely reactive edema of the aryepiglottic folds with narrowing of the supraglottic airway. 6. The findings of this study were discussed with Dr. Ernest at 7:30PM on 08/22/2023. Additional clinical history obtained. Given soft tissue swelling without other clinical signs of infection, the findings may represent post operative seroma. The fluid along the sternocleidomastoid extending anteriorly in the right neck does not demonstrate convincing peripheral enhancement. The collection with the right retropharynx may demonstrate mild peripheral enhancement although evaluation in this region is limited by streak artifact from hardware. Both areas of fluid may reflect postoperative seroma. Electronically signed by: Donnice Mania MD 08/22/2023 07:36 PM EDT RP Workstation: [id number]   Addendum Date: 08/22/2023  ADDENDUM #1  EXAM: CT NECK WITH CONTRAST 08/22/2023 05:58:36 PM TECHNIQUE: CT of the neck was performed with the administration of intravenous contrast. Multiplanar reformatted images are provided for review. Automated exposure control, iterative reconstruction, and/or weight based adjustment of the mA/kV was utilized to reduce the radiation dose to as low as reasonably achievable. COMPARISON: CT of the cervical spine dated 08/13/2023. CLINICAL HISTORY: Brachial plexopathy, nontraumatic. Patient to ED via POV for post-op problem. PT reports having surgery last Tuesday that required incision on front of throat. Pt reports when hot that the throat swells up making it hard to breathe. Denies any breathing difficulties at this time. NAD ; noted. States he was also prescribed pain meds for 7 days but only had enough for 5 days and now is out. FINDINGS: AERODIGESTIVE TRACT: Edematous appearance of the pharyngeal mucosa likely reflecting reactive changes. Normal appearance of the epiglottis. There is also likely reactive edema of the aryepiglottic  folds with narrowing of the supraglottic airway seen on series 2 image 82. SALIVARY GLANDS: The parotid and submandibular glands are unremarkable. THYROID: Unremarkable. LYMPH NODES: No enlarged lymph nodes within the neck. SOFT TISSUES: Interval postsurgical changes of ACDF spanning C5 to T1. Hardware is intact. There is a peripheral enhancing collection within the right aspect of the retropharynx centered around the level of C5-6 which measures approximately 3.0 x 1.0 x 2.8 cm. There is associated soft tissue swelling within the retropharynx extending laterally and exerting mass effect on the right carotid space. Additional retropharyngeal effusion extending superiorly to the level of C2 without definite peripheral enhancement. Soft tissue swelling extends anteriorly within the right aspect of the neck into the soft tissues adjacent to the sternocleidomastoid muscle likely reflecting postsurgical changes.  BRAIN, ORBITS, SINUSES AND MASTOIDS: Mild mucosal thickening in the ethmoid sinuses, right sphenoid sinus, and left maxillary sinus. No air-fluid levels. Chronic-appearing deformity of the right lamina papyracea. LUNGS AND MEDIASTINUM: No acute abnormality. BONES: Multiple dental caries noted. IMPRESSION: 1. Peripheral enhancing collection within the right aspect of the retropharynx at the level of C5-6 measuring up to 3.0 cm concerning for abscess. Associated soft tissue swelling extending laterally and exerting mass effect on the right carotid space. 2. Additional fluid noted extending along the posterior medial margin of the right sternocleidomastoid muscle extending into the overlying subcutaneous tissues which may reflect additional abscess. 3. Retropharyngeal effusion extending superiorly to the level of C2. 4. Edematous appearance of the pharyngeal mucosa, likely reactive changes. 5. Likely reactive edema of the aryepiglottic folds with narrowing of the supraglottic airway. Electronically signed by:  Donnice Mania MD 08/22/2023 07:33 PM EDT RP Workstation: [id number]   Result Date: 08/22/2023  ORIGINAL REPORT EXAM: CT NECK WITH CONTRAST 08/22/2023 05:58:36 PM TECHNIQUE: CT of the neck was performed with the administration of intravenous contrast. Multiplanar reformatted images are provided for review. Automated exposure control, iterative reconstruction, and/or weight based adjustment of the mA/kV was utilized to reduce the radiation dose to as low as reasonably achievable. COMPARISON: CT of the cervical spine dated 08/13/2023. CLINICAL HISTORY: Brachial plexopathy, nontraumatic. Patient to ED via POV for post-op problem. PT reports having surgery last Tuesday that required incision on front of throat. Pt reports when hot that the throat swells up making it hard to breathe. Denies any breathing difficulties at this time. NAD ; noted. States he was also prescribed pain meds for 7 days but only had enough for 5 days and now is out. FINDINGS: AERODIGESTIVE TRACT: Edematous appearance of the pharyngeal mucosa likely reflecting reactive changes. Normal appearance of the epiglottis. There is also likely reactive edema of the aryepiglottic folds with narrowing of the supraglottic airway seen on series 2 image 82. SALIVARY GLANDS: The parotid and submandibular glands are unremarkable. THYROID: Unremarkable. LYMPH NODES: No enlarged lymph nodes within the neck. SOFT TISSUES: Interval postsurgical changes of ACDF spanning C5 to T1. Hardware is intact. There is a peripheral enhancing collection within the right aspect of the retropharynx centered around the level of C5-6 which measures approximately 3.0 x 1.0 x 2.8 cm. There is associated soft tissue swelling within the retropharynx extending laterally and exerting mass effect on the right carotid space. Additional retropharyngeal effusion extending superiorly to the level of C2 without definite peripheral enhancement. Soft tissue swelling extends anteriorly within the  right aspect of the neck into the soft tissues adjacent to the sternocleidomastoid muscle likely reflecting postsurgical changes. BRAIN, ORBITS, SINUSES AND MASTOIDS: Mild mucosal thickening in the ethmoid sinuses, right sphenoid sinus, and left maxillary sinus. No air-fluid levels. Chronic-appearing deformity of the right lamina papyracea. LUNGS AND MEDIASTINUM: No acute abnormality. BONES: Multiple dental caries noted. IMPRESSION: 1. Peripheral enhancing collection within the right aspect of the retropharynx at the level of C5-6 measuring up to 3.0 cm concerning for abscess. Associated soft tissue swelling extending laterally and exerting mass effect on the right carotid space. 2. Additional fluid noted extending along the posterior medial margin of the right sternocleidomastoid muscle extending into the overlying subcutaneous tissues which may reflect additional abscess. 3. Retropharyngeal effusion extending superiorly to the level of C2. 4. Edematous appearance of the pharyngeal mucosa, likely reactive changes. 5. Likely reactive edema of the aryepiglottic folds with narrowing of the supraglottic airway. Electronically signed by: Donnice  Hunt MD 08/22/2023 07:16 PM EDT RP Workstation: [id number]               LOS: 2 days   Krishna Dancel  [facility]   Pager on [email or web address]. If 7PM-7AM, please contact night-coverage at [email or web address]     08/24/2023, 3:31 PM

## 2023-08-24 NOTE — Telephone Encounter (Signed)
 Patient is requesting a call back so that he can get clarification from Dr. Claudene on the surgery that was performed. He states that he had a worker's comp injury that occurred on 07/18/2023 for his lower back into his legs. He states he returned to light duty and began having pain after working for an hour. He states that his worker's comp is trying to make this two separate injuries and he feels as if everything is connected. He is requesting a call back before he is discharged.

## 2023-08-24 NOTE — Progress Notes (Signed)
 Mobility Specialist - Progress Note   08/24/23 1154  Mobility  Activity Ambulated independently  Level of Assistance Independent  Assistive Device None  Distance Ambulated (ft) 160 ft  Activity Response Tolerated well  Mobility visit 1 Mobility  Mobility Specialist Start Time (ACUTE ONLY) 1147  Mobility Specialist Stop Time (ACUTE ONLY) 1152  Mobility Specialist Time Calculation (min) (ACUTE ONLY) 5 min   Pt standing in the room upon entry, utilizing RA. Pt amb one lap around the NS indep--- tolerated well. Pt returned to the room, left standing near the bed needs within reach.  America Silvan Mobility Specialist 08/24/23 12:00 PM

## 2023-08-24 NOTE — Progress Notes (Signed)
 Pt d/c homne to self. AVS reviewed. IV removed, no further needs voiced at this time. BP 134/85 (BP Location: Left Arm)   Pulse 81   Temp 98 F (36.7 C)   Resp 18   Ht 6' (1.829 m)   Wt 75 kg   SpO2 99%   BMI 22.42 kg/m  08/24/23 Neil Parks Louder 5:13 PM

## 2023-08-24 NOTE — Discharge Summary (Signed)
 Physician Discharge Summary   Patient: Neil Parks MRN: [id number] DOB: 11-Jul-1965  Admit date:     08/22/2023  Discharge date: 08/24/23  Discharge Physician: AIDA CHO   PCP: Patient, No Pcp Per   Recommendations at discharge:   Follow-up with Dr. Penne Sharps, neurosurgeon, on Monday, 08/27/2023 Establish care with PCP for routine health maintenance and also follow-up with a gastroenterologist for positive hepatitis C antibody  Discharge Diagnoses: Principal Problem:   Foraminal stenosis of cervical region Active Problems:   Radiculopathy affecting upper extremity   Left hand weakness   Abnormal liver function   Tobacco abuse   Polysubstance abuse (HCC)   Cocaine abuse (HCC)   Overweight (BMI 25.0-29.9)   Neck swelling   Hepatitis C antibody positive  Resolved Problems:   * No resolved hospital problems. [facility] Course:   Neil Parks is a 58 y.o. male  with medical history significant of foraminal stenosis of cervical spine, radiculopathy affecting upper extremity with left hand weakness, polysubstance use disorder (cocaine, marijuana, tobacco) who presented to the hospital with neck pain, painful swallowing and difficulty swallowing.   Patient was recently hospitalized from 7/20 - 7/24.  He is s/p C5-T1 anterior cervical discectomy & fusion by Dr. Sharps of neurosurgery on 08/14/23.  He had been taking oxycodone  for 5 days as prescribed but he ran out of pain medications.  He continues to have pain at the anterior neck incisional site and noticed swelling in the anterior neck area.  He had pain and difficulty swallowing so he had to cut up his food into smaller pieces.  He also reported he has poor living situation and not been staying in his car.     CT soft tissue neck with contrast:     IMPRESSION: 1. Peripheral enhancing collection within the right aspect of the retropharynx at the level of C5-6 measuring up to 3.0 cm concerning for abscess.  Associated soft tissue swelling extending laterally and exerting mass effect on the right carotid space. 2. Additional fluid noted extending along the posterior medial margin of the right sternocleidomastoid muscle extending into the overlying subcutaneous tissues which may reflect additional abscess. 3. Retropharyngeal effusion extending superiorly to the level of C2. 4. Edematous appearance of the pharyngeal mucosa, likely reactive changes. 5. Likely reactive edema of the aryepiglottic folds with narrowing of the supraglottic airway. 6. The findings of this study were discussed with Dr. Ernest at 7:30PM on 08/22/2023. Additional clinical history obtained. Given soft tissue swelling without other clinical signs of infection, the findings may represent post operative seroma. The fluid along the sternocleidomastoid extending anteriorly in the right neck does not demonstrate convincing peripheral enhancement. The collection with the right retropharynx may demonstrate mild peripheral enhancement although evaluation in this region is limited by streak artifact from hardware. Both areas of fluid may reflect postoperative seroma.          Assessment and Plan:   Foraminal stenosis of cervical spine and radiculopathy affecting upper extremity, left hand weakness:   He is s/p C5-T1 anterior cervical discectomy & fusion by Dr. Sharps of neurosurgery on 08/14/23. PT evaluation     Postoperative neck swelling and dysphagia: Improved with analgesics and IV steroids. He is tolerating a regular diet without any difficulty. Case discussed with Dr. Katrina, in person.  I recommended Medrol Dosepak at discharge. He will be seen in the clinic on 08/27/2023 for follow-up of.     Elevated liver enzymes, positive hepatitis C antibody: Hepatitis  C antibody positive but hepatitis C IgM nonreactive Patient advised to follow-up with a gastroenterologist for further evaluation.  He said he will be  traveling to [place]  and will follow-up when he gets there.    Polysubstance use disorder (cocaine, marijuana, tobacco): Counseled to quit       Homelessness, poor living situation:  He said he is traveling to [place]  in about a week to recuperate.     His condition has improved and he is deemed stable for discharge home.     Pain control - [place]  Controlled Substance Reporting System database was reviewed. and patient was instructed, not to drive, operate heavy machinery, perform activities at heights, swimming or participation in water activities or provide baby-sitting services while on Pain, Sleep and Anxiety Medications; until their outpatient Physician has advised to do so again. Also recommended to not to take more than prescribed Pain, Sleep and Anxiety Medications.  Consultants: Neurosurgeon Procedures performed: None Disposition: Home Diet recommendation:  Cardiac diet DISCHARGE MEDICATION: Allergies as of 08/24/2023   No Known Allergies      Medication List     STOP taking these medications    methocarbamol  500 MG tablet Commonly known as: Robaxin    nabumetone  750 MG tablet Commonly known as: RELAFEN        TAKE these medications    methylPREDNISolone 4 MG Tbpk tablet Commonly known as: MEDROL DOSEPAK Take 24 mg on day 1, 20 mg on day 2, 16 mg on day 3, 12 mg on day 4, 8 mg on day 5 and 4 mg on day 6   oxyCODONE  5 MG immediate release tablet Commonly known as: Oxy IR/ROXICODONE  Take 1 tablet (5 mg total) by mouth every 8 (eight) hours as needed for moderate pain (pain score 4-6). What changed:  medication strength how much to take when to take this reasons to take this        Follow-up Information     Claudene Penne ORN, MD. Go on 08/27/2023.   Specialty: Neurosurgery Contact information: [address] [place] [address] [phone]                Discharge Exam: Fredricka Weights   08/22/23 1516 08/22/23  2150  Weight: 83.9 kg 75 kg   GEN: NAD SKIN: Warm and dry EYES: No pallor or icterus ENT: MMM, anterior neck incision wound is healing nicely, clean, dry and intact.  Anterior neck swelling has resolved CV: RRR PULM: CTA B ABD: soft, ND, NT, +BS CNS: AAO x 3, non focal EXT: No edema or tenderness   Condition at discharge: good  The results of significant diagnostics from this hospitalization (including imaging, microbiology, ancillary and laboratory) are listed below for reference.   Imaging Studies: DG Cervical Spine 2 or 3 views Result Date: 08/22/2023 CLINICAL DATA:  Swelling. Surgery last Thursday requiring incision to the front of the throat. Throat swells up with difficulty breathing. EXAM: CERVICAL SPINE - 2-3 VIEW COMPARISON:  08/13/2023 FINDINGS: Interval postoperative changes with anterior plate and screw fixation and intervertebral fusion from C5 through T1. Surgical hardware appears intact. Normal alignment of the cervical spine. No vertebral compression deformities. There is prominent increased prevertebral soft tissue swelling extending from about C3 through T1. This could represent postoperative change or inflammatory/infectious process. No soft tissue gas or radiopaque foreign bodies are demonstrated. Cervical airway appears patent. IMPRESSION: 1. Postoperative anterior fixation with intervertebral fusion from C5 through T1. 2. Prominent prevertebral soft tissue swelling. Electronically Signed  By: Elsie Gravely M.D.   On: 08/22/2023 20:49   CT Soft Tissue Neck W Contrast Addendum Date: 08/22/2023 ADDENDUM #2 EXAM: CT NECK WITH CONTRAST 08/22/2023 05:58:36 PM TECHNIQUE: CT of the neck was performed with the administration of intravenous contrast. Multiplanar reformatted images are provided for review. Automated exposure control, iterative reconstruction, and/or weight based adjustment of the mA/kV was utilized to reduce the radiation dose to as low as reasonably achievable.  COMPARISON: CT of the cervical spine dated 08/13/2023. CLINICAL HISTORY: Brachial plexopathy, nontraumatic. Patient to ED via POV for post-op problem. PT reports having surgery last Tuesday that required incision on front of throat. Pt reports when hot that the throat swells up making it hard to breathe. Denies any breathing difficulties at this time. NAD ; noted. States he was also prescribed pain meds for 7 days but only had enough for 5 days and now is out. FINDINGS: AERODIGESTIVE TRACT: Edematous appearance of the pharyngeal mucosa likely reflecting reactive changes. Normal appearance of the epiglottis. There is also likely reactive edema of the aryepiglottic folds with narrowing of the supraglottic airway seen on series 2 image 82. SALIVARY GLANDS: The parotid and submandibular glands are unremarkable. THYROID: Unremarkable. LYMPH NODES: No enlarged lymph nodes within the neck. SOFT TISSUES: Interval postsurgical changes of ACDF spanning C5 to T1. Hardware is intact. There is a peripheral enhancing collection within the right aspect of the retropharynx centered around the level of C5-6 which measures approximately 3.0 x 1.0 x 2.8 cm. There is associated soft tissue swelling within the retropharynx extending laterally and exerting mass effect on the right carotid space. Additional retropharyngeal effusion extending superiorly to the level of C2 without definite peripheral enhancement. Soft tissue swelling extends anteriorly within the right aspect of the neck into the soft tissues adjacent to the sternocleidomastoid muscle likely reflecting postsurgical changes. BRAIN, ORBITS, SINUSES AND MASTOIDS: Mild mucosal thickening in the ethmoid sinuses, right sphenoid sinus, and left maxillary sinus. No air-fluid levels. Chronic-appearing deformity of the right lamina papyracea. LUNGS AND MEDIASTINUM: No acute abnormality. BONES: Multiple dental caries noted. IMPRESSION: 1. Peripheral enhancing collection within the  right aspect of the retropharynx at the level of C5-6 measuring up to 3.0 cm concerning for abscess. Associated soft tissue swelling extending laterally and exerting mass effect on the right carotid space. 2. Additional fluid noted extending along the posterior medial margin of the right sternocleidomastoid muscle extending into the overlying subcutaneous tissues which may reflect additional abscess. 3. Retropharyngeal effusion extending superiorly to the level of C2. 4. Edematous appearance of the pharyngeal mucosa, likely reactive changes. 5. Likely reactive edema of the aryepiglottic folds with narrowing of the supraglottic airway. 6. The findings of this study were discussed with Dr. Ernest at 7:30PM on 08/22/2023. Additional clinical history obtained. Given soft tissue swelling without other clinical signs of infection, the findings may represent post operative seroma. The fluid along the sternocleidomastoid extending anteriorly in the right neck does not demonstrate convincing peripheral enhancement. The collection with the right retropharynx may demonstrate mild peripheral enhancement although evaluation in this region is limited by streak artifact from hardware. Both areas of fluid may reflect postoperative seroma. Electronically signed by: Donnice Mania MD 08/22/2023 07:36 PM EDT RP Workstation: [id number]   Addendum Date: 08/22/2023  ADDENDUM #1 EXAM: CT NECK WITH CONTRAST 08/22/2023 05:58:36 PM TECHNIQUE: CT of the neck was performed with the administration of intravenous contrast. Multiplanar reformatted images are provided for review. Automated exposure control, iterative reconstruction, and/or weight based adjustment  of the mA/kV was utilized to reduce the radiation dose to as low as reasonably achievable. COMPARISON: CT of the cervical spine dated 08/13/2023. CLINICAL HISTORY: Brachial plexopathy, nontraumatic. Patient to ED via POV for post-op problem. PT reports having surgery last Tuesday that  required incision on front of throat. Pt reports when hot that the throat swells up making it hard to breathe. Denies any breathing difficulties at this time. NAD ; noted. States he was also prescribed pain meds for 7 days but only had enough for 5 days and now is out. FINDINGS: AERODIGESTIVE TRACT: Edematous appearance of the pharyngeal mucosa likely reflecting reactive changes. Normal appearance of the epiglottis. There is also likely reactive edema of the aryepiglottic folds with narrowing of the supraglottic airway seen on series 2 image 82. SALIVARY GLANDS: The parotid and submandibular glands are unremarkable. THYROID: Unremarkable. LYMPH NODES: No enlarged lymph nodes within the neck. SOFT TISSUES: Interval postsurgical changes of ACDF spanning C5 to T1. Hardware is intact. There is a peripheral enhancing collection within the right aspect of the retropharynx centered around the level of C5-6 which measures approximately 3.0 x 1.0 x 2.8 cm. There is associated soft tissue swelling within the retropharynx extending laterally and exerting mass effect on the right carotid space. Additional retropharyngeal effusion extending superiorly to the level of C2 without definite peripheral enhancement. Soft tissue swelling extends anteriorly within the right aspect of the neck into the soft tissues adjacent to the sternocleidomastoid muscle likely reflecting postsurgical changes. BRAIN, ORBITS, SINUSES AND MASTOIDS: Mild mucosal thickening in the ethmoid sinuses, right sphenoid sinus, and left maxillary sinus. No air-fluid levels. Chronic-appearing deformity of the right lamina papyracea. LUNGS AND MEDIASTINUM: No acute abnormality. BONES: Multiple dental caries noted. IMPRESSION: 1. Peripheral enhancing collection within the right aspect of the retropharynx at the level of C5-6 measuring up to 3.0 cm concerning for abscess. Associated soft tissue swelling extending laterally and exerting mass effect on the right carotid  space. 2. Additional fluid noted extending along the posterior medial margin of the right sternocleidomastoid muscle extending into the overlying subcutaneous tissues which may reflect additional abscess. 3. Retropharyngeal effusion extending superiorly to the level of C2. 4. Edematous appearance of the pharyngeal mucosa, likely reactive changes. 5. Likely reactive edema of the aryepiglottic folds with narrowing of the supraglottic airway. Electronically signed by: Donnice Mania MD 08/22/2023 07:33 PM EDT RP Workstation: [id number]   Result Date: 08/22/2023 ORIGINAL REPORT  EXAM: CT NECK WITH CONTRAST 08/22/2023 05:58:36 PM TECHNIQUE: CT of the neck was performed with the administration of intravenous contrast. Multiplanar reformatted images are provided for review. Automated exposure control, iterative reconstruction, and/or weight based adjustment of the mA/kV was utilized to reduce the radiation dose to as low as reasonably achievable. COMPARISON: CT of the cervical spine dated 08/13/2023. CLINICAL HISTORY: Brachial plexopathy, nontraumatic. Patient to ED via POV for post-op problem. PT reports having surgery last Tuesday that required incision on front of throat. Pt reports when hot that the throat swells up making it hard to breathe. Denies any breathing difficulties at this time. NAD ; noted. States he was also prescribed pain meds for 7 days but only had enough for 5 days and now is out. FINDINGS: AERODIGESTIVE TRACT: Edematous appearance of the pharyngeal mucosa likely reflecting reactive changes. Normal appearance of the epiglottis. There is also likely reactive edema of the aryepiglottic folds with narrowing of the supraglottic airway seen on series 2 image 82. SALIVARY GLANDS: The parotid and submandibular glands are unremarkable.  THYROID: Unremarkable. LYMPH NODES: No enlarged lymph nodes within the neck. SOFT TISSUES: Interval postsurgical changes of ACDF spanning C5 to T1. Hardware is intact. There is  a peripheral enhancing collection within the right aspect of the retropharynx centered around the level of C5-6 which measures approximately 3.0 x 1.0 x 2.8 cm. There is associated soft tissue swelling within the retropharynx extending laterally and exerting mass effect on the right carotid space. Additional retropharyngeal effusion extending superiorly to the level of C2 without definite peripheral enhancement. Soft tissue swelling extends anteriorly within the right aspect of the neck into the soft tissues adjacent to the sternocleidomastoid muscle likely reflecting postsurgical changes. BRAIN, ORBITS, SINUSES AND MASTOIDS: Mild mucosal thickening in the ethmoid sinuses, right sphenoid sinus, and left maxillary sinus. No air-fluid levels. Chronic-appearing deformity of the right lamina papyracea. LUNGS AND MEDIASTINUM: No acute abnormality. BONES: Multiple dental caries noted. IMPRESSION: 1. Peripheral enhancing collection within the right aspect of the retropharynx at the level of C5-6 measuring up to 3.0 cm concerning for abscess. Associated soft tissue swelling extending laterally and exerting mass effect on the right carotid space. 2. Additional fluid noted extending along the posterior medial margin of the right sternocleidomastoid muscle extending into the overlying subcutaneous tissues which may reflect additional abscess. 3. Retropharyngeal effusion extending superiorly to the level of C2. 4. Edematous appearance of the pharyngeal mucosa, likely reactive changes. 5. Likely reactive edema of the aryepiglottic folds with narrowing of the supraglottic airway. Electronically signed by: Donnice Mania MD 08/22/2023 07:16 PM EDT RP Workstation: [id number]   DG Cervical Spine 2-3 Views Result Date: 08/14/2023 CLINICAL DATA:  Elective surgery. EXAM: CERVICAL SPINE - 2-3 VIEW COMPARISON:  None Available. FINDINGS: Two fluoroscopic spot views of the cervical spine submitted from the operating room. Anterior fusion  C5 through T1. Interbody spacers are seen at C5-C6 and C6-C7. Lower aspect of the hardware is not well visualized on the lateral view due to soft tissue attenuation. Fluoroscopy time 20 seconds. Dose 3.29 mGy. IMPRESSION: Intraoperative fluoroscopy during cervical fusion. Electronically Signed   By: Andrea Gasman M.D.   On: 08/14/2023 11:47   DG C-Arm 1-60 Min-No Report Result Date: 08/14/2023 Fluoroscopy was utilized by the requesting physician.  No radiographic interpretation.   DG C-Arm 1-60 Min-No Report Result Date: 08/14/2023 Fluoroscopy was utilized by the requesting physician.  No radiographic interpretation.   DG C-Arm 1-60 Min-No Report Result Date: 08/14/2023 Fluoroscopy was utilized by the requesting physician.  No radiographic interpretation.   CT CERVICAL SPINE WO CONTRAST Result Date: 08/13/2023 CLINICAL DATA:  Cervical radiculopathy. Continued neck and back pain. Loss of control of left arm and hand. EXAM: CT CERVICAL SPINE WITHOUT CONTRAST TECHNIQUE: Multidetector CT imaging of the cervical spine was performed without intravenous contrast. Multiplanar CT image reconstructions were also generated. RADIATION DOSE REDUCTION: This exam was performed according to the departmental dose-optimization program which includes automated exposure control, adjustment of the mA and/or kV according to patient size and/or use of iterative reconstruction technique. COMPARISON:  MR cervical spine 08/12/2023. CT cervical spine 10/29/2015. FINDINGS: Alignment: Straightening without focal angulation or listhesis. Skull base and vertebrae: No evidence of acute cervical spine fracture or traumatic subluxation. Chronic endplate degenerative changes have mildly progressed compared with the remote CT from 2017, most advanced from C4-5 through C7-T1. Chronic ossification of the ligamentum nuchae. Soft tissues and spinal canal: No prevertebral fluid or swelling. No visible canal hematoma. Disc levels: As  correlated with recent MRI, and no significant spinal stenosis,  foraminal narrowing or nerve root impingement at C2-3 or C3-4. C4-5: Moderate loss of disc height with disc bulging, endplate osteophytes and bilateral uncinate spurring. Mild foraminal narrowing bilaterally. C5-6: Moderate loss of disc height with posterior osteophytes covering diffusely bulging disc material. Resulting mild to moderate multifactorial spinal stenosis and moderate to severe foraminal narrowing bilaterally, as seen on MRI. C6-7: Moderate loss of disc height with posterior osteophytes covering diffusely bulging disc material. Asymmetric uncinate spurring on the left with moderate to severe left foraminal narrowing and possible left C7 nerve root impingement. C7-T1: Advanced loss of disc height with posterior osteophytes covering diffusely bulging disc material. Asymmetric disc osteophyte complex within the left foramen with resulting severe left foraminal narrowing and probable left C8 nerve root impingement. Moderate to severe right foraminal narrowing. Upper chest: Clear lung apices. Other: None. IMPRESSION: 1. No evidence of acute cervical spine fracture, traumatic subluxation or static signs of instability. 2. Multilevel cervical spondylosis as described, mildly progressed compared with remote CT from 2017. 3. Individual disc space levels are detailed on previous day MRI. Mild to moderate multifactorial spinal stenosis at C5-6. Multilevel foraminal narrowing as described, greatest C5-6, C6-7 and C7-T1. Electronically Signed   By: Elsie Perone M.D.   On: 08/13/2023 11:34   DG Cervical Spine With Flex & Extend Result Date: 08/13/2023 EXAM: Flexion and Extension VIEW(S) XRAY OF THE CERVICAL SPINE 08/13/2023 01:27:00 AM COMPARISON: None available. CLINICAL HISTORY: Evaluate for radiculopathy. Pt states that he was injured at work on 07/18/23 and has been having neck and left arm pain since. FINDINGS: BONES: No acute fracture. No  aggressive appearing osseous lesion. Alignment is normal. No abnormal motion with flexion/extension. DISCS AND DEGENERATIVE CHANGES: Mild degenerative changes in the mid/lower cervical spine. Mild narrowing of the right C5-6 neural foramen. Moderate narrowing of the right C7-T1 neural foramen. Evaluation of the left neural foramina is constrained by obliquity, but mild narrowing is suspected at C5-6. SOFT TISSUES: No prevertebral soft tissue swelling. The visualized lungs appear clear. IMPRESSION: 1. No abnormal motion with flexion/extension. 2. Mild degenerative changes in the mid/lower cervical spine. 3. Bilateral neural foraminal narrowing, as above. Electronically signed by: Pinkie Pebbles MD 08/13/2023 01:38 AM EDT RP Workstation: [id number]   MR Cervical Spine W and Wo Contrast Result Date: 08/13/2023 EXAM: MRI BRAIN WITHOUT CONTRAST MRI CERVICAL SPINE WITHOUT AND WITH CONTRAST MRI THORACIC SPINE WITHOUT AND WITH CONTRAST 08/12/2023 11:54:41 PM TECHNIQUE: Multiplanar multisequence MRI of the brain was performed without the administration of intravenous contrast. Multiplanar multisequence MRI of the cervical and thoracic spine was performed without and with the administration of intravenous contrast. CONTRAST: 10mL gadavist  COMPARISON: None available. CLINICAL HISTORY: Unable to flex left hand, most consistent with radiculopathy, but risk factors for CVA. FINDINGS: MRI BRAIN: BRAIN AND VENTRICLES: No acute infarct. No acute intracranial hemorrhage. No mass or abnormal enhancement. No midline shift. No hydrocephalus. The sella is unremarkable. Normal flow voids. ORBITS: No acute abnormality. SINUSES AND MASTOIDS: Mild ethmoid sinus mucosal thickening. BONES AND SOFT TISSUES: Normal bone marrow signal. No acute soft tissue abnormality. MRI CERVICAL AND THORACIC SPINE: BONES AND ALIGNMENT: Mild degenerative height loss at the C4-6 levels. No abnormal enhancement. SPINAL CORD: No focal lesion of the spinal  cord. SOFT TISSUES: Unremarkable. C2-C3: No significant disc herniation. No spinal canal stenosis or neural foraminal narrowing. C3-C4: Small central disc protrusion without stenosis. C4-C5: No significant disc herniation. No spinal canal stenosis or neural foraminal narrowing. C5-C6: Small disc osteophyte complex with mild spinal canal  stenosis and severe bilateral foraminal stenosis. C6-C7: Left asymmetric disc bulge with severe left foraminal stenosis. C7-T1: Small disc bulge with severe bilateral foraminal stenosis. No disk herniation, spinal canal stenosis or neural foraminal stenosis of the thoracic spine. IMPRESSION: 1. No acute intracranial abnormality or abnormal enhancement. 2. C5-6 small disc osteophyte complex with mild spinal canal stenosis and severe bilateral foraminal stenosis. 3. C6-7 left asymmetric disc bulge with severe left foraminal stenosis. 4. C7-T1 small disc bulge with severe bilateral foraminal stenosis. Electronically signed by: Franky Stanford MD 08/13/2023 12:18 AM EDT RP Workstation: [id number]   MR THORACIC SPINE W WO CONTRAST Result Date: 08/13/2023 EXAM: MRI BRAIN WITHOUT CONTRAST MRI CERVICAL SPINE WITHOUT AND WITH CONTRAST MRI THORACIC SPINE WITHOUT AND WITH CONTRAST 08/12/2023 11:54:41 PM TECHNIQUE: Multiplanar multisequence MRI of the brain was performed without the administration of intravenous contrast. Multiplanar multisequence MRI of the cervical and thoracic spine was performed without and with the administration of intravenous contrast. CONTRAST: 10mL gadavist  COMPARISON: None available. CLINICAL HISTORY: Unable to flex left hand, most consistent with radiculopathy, but risk factors for CVA. FINDINGS: MRI BRAIN: BRAIN AND VENTRICLES: No acute infarct. No acute intracranial hemorrhage. No mass or abnormal enhancement. No midline shift. No hydrocephalus. The sella is unremarkable. Normal flow voids. ORBITS: No acute abnormality. SINUSES AND MASTOIDS: Mild ethmoid sinus  mucosal thickening. BONES AND SOFT TISSUES: Normal bone marrow signal. No acute soft tissue abnormality. MRI CERVICAL AND THORACIC SPINE: BONES AND ALIGNMENT: Mild degenerative height loss at the C4-6 levels. No abnormal enhancement. SPINAL CORD: No focal lesion of the spinal cord. SOFT TISSUES: Unremarkable. C2-C3: No significant disc herniation. No spinal canal stenosis or neural foraminal narrowing. C3-C4: Small central disc protrusion without stenosis. C4-C5: No significant disc herniation. No spinal canal stenosis or neural foraminal narrowing. C5-C6: Small disc osteophyte complex with mild spinal canal stenosis and severe bilateral foraminal stenosis. C6-C7: Left asymmetric disc bulge with severe left foraminal stenosis. C7-T1: Small disc bulge with severe bilateral foraminal stenosis. No disk herniation, spinal canal stenosis or neural foraminal stenosis of the thoracic spine. IMPRESSION: 1. No acute intracranial abnormality or abnormal enhancement. 2. C5-6 small disc osteophyte complex with mild spinal canal stenosis and severe bilateral foraminal stenosis. 3. C6-7 left asymmetric disc bulge with severe left foraminal stenosis. 4. C7-T1 small disc bulge with severe bilateral foraminal stenosis. Electronically signed by: Franky Stanford MD 08/13/2023 12:18 AM EDT RP Workstation: [id number]   MR BRAIN WO CONTRAST Result Date: 08/13/2023 EXAM: MRI BRAIN WITHOUT CONTRAST MRI CERVICAL SPINE WITHOUT AND WITH CONTRAST MRI THORACIC SPINE WITHOUT AND WITH CONTRAST 08/12/2023 11:54:41 PM TECHNIQUE: Multiplanar multisequence MRI of the brain was performed without the administration of intravenous contrast. Multiplanar multisequence MRI of the cervical and thoracic spine was performed without and with the administration of intravenous contrast. CONTRAST: 10mL gadavist  COMPARISON: None available. CLINICAL HISTORY: Unable to flex left hand, most consistent with radiculopathy, but risk factors for CVA. FINDINGS: MRI BRAIN:  BRAIN AND VENTRICLES: No acute infarct. No acute intracranial hemorrhage. No mass or abnormal enhancement. No midline shift. No hydrocephalus. The sella is unremarkable. Normal flow voids. ORBITS: No acute abnormality. SINUSES AND MASTOIDS: Mild ethmoid sinus mucosal thickening. BONES AND SOFT TISSUES: Normal bone marrow signal. No acute soft tissue abnormality. MRI CERVICAL AND THORACIC SPINE: BONES AND ALIGNMENT: Mild degenerative height loss at the C4-6 levels. No abnormal enhancement. SPINAL CORD: No focal lesion of the spinal cord. SOFT TISSUES: Unremarkable. C2-C3: No significant disc herniation. No spinal canal stenosis or neural foraminal  narrowing. C3-C4: Small central disc protrusion without stenosis. C4-C5: No significant disc herniation. No spinal canal stenosis or neural foraminal narrowing. C5-C6: Small disc osteophyte complex with mild spinal canal stenosis and severe bilateral foraminal stenosis. C6-C7: Left asymmetric disc bulge with severe left foraminal stenosis. C7-T1: Small disc bulge with severe bilateral foraminal stenosis. No disk herniation, spinal canal stenosis or neural foraminal stenosis of the thoracic spine. IMPRESSION: 1. No acute intracranial abnormality or abnormal enhancement. 2. C5-6 small disc osteophyte complex with mild spinal canal stenosis and severe bilateral foraminal stenosis. 3. C6-7 left asymmetric disc bulge with severe left foraminal stenosis. 4. C7-T1 small disc bulge with severe bilateral foraminal stenosis. Electronically signed by: Franky Stanford MD 08/13/2023 12:18 AM EDT RP Workstation: [id number]   CT HEAD WO CONTRAST Result Date: 08/12/2023 EXAM: CT HEAD WITHOUT CONTRAST 08/12/2023 09:58:42 PM TECHNIQUE: CT of the head was performed without the administration of intravenous contrast. Automated exposure control, iterative reconstruction, and/or weight based adjustment of the mA/kV was utilized to reduce the radiation dose to as low as reasonably achievable.  COMPARISON: None available. CLINICAL HISTORY: Numbness or tingling, paresthesia (Ped 0-17y). Pt complaints of left arm pain and numbness; Per pt I was supposed to have an mri of my back on 7/18 but didn't go. I was moving some clothes (nothing heavy) went and sat on the porch started to pick up my cigar and my arm just stopped working right.; Per pt this all happened around noon today. FINDINGS: BRAIN AND VENTRICLES: No acute hemorrhage. Gray-white differentiation is preserved. No hydrocephalus. No extra-axial collection. No mass effect or midline shift. ORBITS: No acute abnormality. SINUSES: Mild paranasal sinus mucosal thickening. SOFT TISSUES AND SKULL: No acute soft tissue abnormality. No skull fracture. IMPRESSION: 1. No acute intracranial abnormality. Electronically signed by: Franky Stanford MD 08/12/2023 10:02 PM EDT RP Workstation: [id number]    Microbiology: Results for orders placed or performed during the hospital encounter of 08/12/23  Surgical PCR screen     Status: None   Collection Time: 08/13/23  1:50 AM   Specimen: Nasal Mucosa; Nasal Swab  Result Value Ref Range Status   MRSA, PCR NEGATIVE NEGATIVE Final   Staphylococcus aureus NEGATIVE NEGATIVE Final    Comment: (NOTE) The Xpert SA Assay (FDA approved for NASAL specimens in patients 56 years of age and older), is one component of a comprehensive surveillance program. It is not intended to diagnose infection nor to guide or monitor treatment. Performed at [facility], [address] [place], [place] [address]     Labs: CBC: Recent Labs  Lab 08/22/23 1518 08/23/23 0300  WBC 7.9 7.0  NEUTROABS 5.1  --   HGB 14.3 13.8  HCT 41.7 41.7  MCV 88.5 89.5  PLT 319 291   Basic Metabolic Panel: Recent Labs  Lab 08/22/23 1518 08/23/23 0300  NA 136 135  K 3.6 4.3  CL 103 106  CO2 23 23  GLUCOSE 155* 174*  BUN 10 11  CREATININE 0.88 0.87  CALCIUM 8.7* 8.9   Liver Function Tests: Recent Labs  Lab  08/22/23 1518  AST 81*  ALT 59*  ALKPHOS 121  BILITOT 0.7  PROT 7.1  ALBUMIN 3.2*   CBG: Recent Labs  Lab 08/22/23 2158 08/23/23 0738 08/24/23 0931  GLUCAP 115* 133* 150*    Discharge time spent: greater than 30 minutes.  Signed: AIDA CHO, MD [facility] 08/24/2023

## 2023-08-24 NOTE — Progress Notes (Signed)
 Occupational Therapy Treatment Patient Details Name: Neil Parks MRN: [id number] DOB: [birth year] Today's Date: 08/24/2023   History of present illness Pt is a 58 year old male presenting to the ED with with postoperative neck swelling and dysphagia; Pt is s/p C5-T1 ACDF on 08/14/23   OT comments  Chart reviewed to date, pt greeted sitting on edge of bed, reports he has just amb multiple laps around nurses station. Tx session targeted improving L hand function with HEP and green therapy sponge provided. Some limitations in wrist extension (to approx 1/2 full available AROM) noted against gravity, improved anti gravity. Good teach back of exercises provided previous admission and today. Pt reports he is feeling much better and has a place to stay after he discharges with one of his aunts. Pt is left as received, all needs met. OT will follow.       If plan is discharge home, recommend the following:  Assist for transportation;A little help with bathing/dressing/bathroom   Equipment Recommendations  None recommended by OT    Recommendations for Other Services      Precautions / Restrictions Precautions Precautions: Cervical Precaution Booklet Issued: No Restrictions Weight Bearing Restrictions Per Provider Order: No       Mobility Bed Mobility Overal bed mobility: Modified Independent                  Transfers Overall transfer level: Modified independent                       Balance Overall balance assessment: Needs assistance Sitting-balance support: Feet supported Sitting balance-Leahy Scale: Normal     Standing balance support: No upper extremity supported Standing balance-Leahy Scale: Good                             ADL either performed or assessed with clinical judgement   ADL Overall ADL's : Modified independent                                            Extremity/Trunk Assessment              Vision        Perception     Praxis     Communication Communication Communication: No apparent difficulties   Cognition Arousal: Alert Behavior During Therapy: WFL for tasks assessed/performed Cognition: No apparent impairments             OT - Cognition Comments: requires cueing for approrpiate post op exercise/activity- he reports he wants to go to the gym; educated him on general precautions after surgery including not lifting more than 10lb                 Following commands: Intact        Cueing   Cueing Techniques: Verbal cues  Exercises Other Exercises Other Exercises: provided green therapy sponge with review of L wrist/hand HEP including adduction/abduction of L fingers, supporing elbow on table and performing flexion/extension of the wrist AROM; grip sponge for improved grip strength; approrpiate demo and teach back    Shoulder Instructions       General Comments      Pertinent Vitals/ Pain       Pain Assessment Pain Assessment: No/denies pain  Home Living  Prior Functioning/Environment              Frequency  Min 1X/week        Progress Toward Goals  OT Goals(current goals can now be found in the care plan section)  Progress towards OT goals: Progressing toward goals  Acute Rehab OT Goals Time For Goal Achievement: 09/06/23  Plan      Co-evaluation                 AM-PAC OT 6 Clicks Daily Activity     Outcome Measure   Help from another person eating meals?: None Help from another person taking care of personal grooming?: None Help from another person toileting, which includes using toliet, bedpan, or urinal?: None Help from another person bathing (including washing, rinsing, drying)?: None Help from another person to put on and taking off regular upper body clothing?: None Help from another person to put on and taking off regular lower body clothing?: None 6 Click  Score: 24    End of Session    OT Visit Diagnosis: Unsteadiness on feet (R26.81);Muscle weakness (generalized) (M62.81);Other abnormalities of gait and mobility (R26.89)   Activity Tolerance Patient tolerated treatment well   Patient Left in bed   Nurse Communication          Time: 8954-8946 OT Time Calculation (min): 8 min  Charges: OT General Charges $OT Visit: 1 Visit OT Treatments $Therapeutic Exercise: 8-22 mins  Therisa Sheffield, OTD OTR/L  08/24/23, 11:40 AM

## 2023-08-27 ENCOUNTER — Ambulatory Visit (INDEPENDENT_AMBULATORY_CARE_PROVIDER_SITE_OTHER): Payer: Self-pay | Admitting: Physician Assistant

## 2023-08-27 ENCOUNTER — Encounter: Payer: Self-pay | Admitting: Physician Assistant

## 2023-08-27 VITALS — BP 138/80 | Temp 97.7°F | Ht 72.0 in | Wt 165.0 lb

## 2023-08-27 DIAGNOSIS — M4802 Spinal stenosis, cervical region: Secondary | ICD-10-CM

## 2023-08-27 DIAGNOSIS — Z09 Encounter for follow-up examination after completed treatment for conditions other than malignant neoplasm: Secondary | ICD-10-CM

## 2023-08-27 MED ORDER — OXYCODONE HCL 5 MG PO TABS: 5.0000 mg | ORAL_TABLET | Freq: Four times a day (QID) | ORAL | 0 refills | Status: DC | PRN

## 2023-08-27 MED ORDER — SENNA 8.6 MG PO TABS
1.0000 | ORAL_TABLET | Freq: Every day | ORAL | 0 refills | Status: DC
Start: 2023-08-27 — End: 2023-09-26

## 2023-08-27 MED ORDER — DOCUSATE SODIUM 100 MG PO CAPS: 100.0000 mg | ORAL_CAPSULE | Freq: Two times a day (BID) | ORAL | 0 refills | Status: DC

## 2023-08-27 MED ORDER — ACETAMINOPHEN 500 MG PO TABS: 1000.0000 mg | ORAL_TABLET | Freq: Three times a day (TID) | ORAL | 2 refills | Status: AC | PRN

## 2023-08-27 NOTE — Progress Notes (Signed)
   REFERRING PHYSICIAN:  No referring provider defined for this encounter.  DOS: 08/14/2023  C5-T1 ACDF  HISTORY OF PRESENT ILLNESS: Neil Parks is 2 weeks status post C5-T1 ACDF on 08/14/2023.  He went back to the ED 8 days postoperatively due to neck pain and swelling which has improved with steroids.  No new weakness, numbness or tingling at this time.  No trouble with breathing, swallowing.     PHYSICAL EXAMINATION:  NEUROLOGICAL:  General: In no acute distress.   Awake, alert, oriented to person, place, and time.  Pupils equal round and reactive to light.  Facial tone is symmetric.    Strength:  Side Biceps Triceps Deltoid Interossei Grip Wrist Ext. Wrist Flex.  R 5 5 5 5 5 5 5   L 5 5 5 3 3 3 3      No obvious swelling near his incision site.   Incision c/d/I  Imaging:  CT Neck 08/22/2023 IMPRESSION: 1. Peripheral enhancing collection within the right aspect of the retropharynx at the level of C5-6 measuring up to 3.0 cm concerning for abscess. Associated soft tissue swelling extending laterally and exerting mass effect on the right carotid space. 2. Additional fluid noted extending along the posterior medial margin of the right sternocleidomastoid muscle extending into the overlying subcutaneous tissues which may reflect additional abscess. 3. Retropharyngeal effusion extending superiorly to the level of C2. 4. Edematous appearance of the pharyngeal mucosa, likely reactive changes. 5. Likely reactive edema of the aryepiglottic folds with narrowing of the supraglottic airway. 6. The findings of this study were discussed with Dr. Ernest at 7:30PM on 08/22/2023. Additional clinical history obtained. Given soft tissue swelling without other clinical signs of infection, the findings may represent post operative seroma. The fluid along the sternocleidomastoid extending anteriorly in the right neck does not demonstrate convincing peripheral enhancement. The collection  with the right retropharynx may demonstrate mild peripheral enhancement although evaluation in this region is limited by streak artifact from hardware. Both areas of fluid may reflect postoperative seroma.  Assessment / Plan: Neil Parks is 2 weeks status post C5-T1 ACDF on 08/14/2023.  He went back to the ED 8 days postoperatively due to neck pain and swelling which has improved with steroids.  No new weakness, numbness or tingling at this time.his examination is to baseline.  Incision is well-appearing.  No significant swelling noted.  We discussed activity escalation and I have advised the patient to lift up to 10 pounds until 6 weeks after surgery, then increase up to 25 pounds until 12 weeks after surgery.  After 12 weeks post-op, the patient advised to increase activity as tolerated. he will return to clinic in approximately 1 month for his 6-week postop visit.Red flag symptoms reviewed in terms of difficulty swallowing or breathing and importance of going to the emergency department if he starts experiencing increased neck swelling or any of the above symptoms.   Advised to contact the office if any questions or concerns arise.   Lyle Decamp PA-C Dept of Neurosurgery

## 2023-08-27 NOTE — Telephone Encounter (Signed)
 Patient has his first post op appointment on 08/27/2023.

## 2023-09-18 ENCOUNTER — Other Ambulatory Visit: Payer: Self-pay | Admitting: Physician Assistant

## 2023-09-18 ENCOUNTER — Telehealth: Payer: Self-pay | Admitting: Neurosurgery

## 2023-09-18 MED ORDER — OXYCODONE HCL 5 MG PO TABS: 5.0000 mg | ORAL_TABLET | Freq: Four times a day (QID) | ORAL | 0 refills | Status: DC | PRN

## 2023-09-18 NOTE — Telephone Encounter (Signed)
 Patient is calling to request a refill of Oxycodone  be sent to [facility] on [address]

## 2023-09-18 NOTE — Telephone Encounter (Signed)
 Patient notified and expressed understanding.

## 2023-09-18 NOTE — Telephone Encounter (Signed)
 Patient has called twice since initial request. I did inform the patient that the provider is in clinic and this takes time to review. We will call him once we have an update.

## 2023-09-19 ENCOUNTER — Telehealth: Payer: Self-pay

## 2023-09-19 NOTE — Telephone Encounter (Signed)
 Noted. Workers Designer, industrial/product will obtain authorization for this.

## 2023-09-19 NOTE — Telephone Encounter (Signed)
 Brooke sent in a prescription on this patient yesterday, today I received a prior authorization request for this, Has he paid out of pocket for this? Please let me know.   I logged onto to Cover My Meds and it stated that the pharmacy had to call  and do the authorization, I attempted to call and try to obtain authorization and when I spoke with Damien she asked for a date of injury and a claim number, but I didn't have either one of these.   She then asked for the patients address and phone number, what they had in their system didn't match what we had in our system so I was unable to obtain authorization for this at this time. Also on Cover My Meds it wouldn't let me click on anything or upload/send to plan.

## 2023-09-19 NOTE — Telephone Encounter (Signed)
 He has not yet picked up the medication from the pharmacy, but is aware that he may have to pay for this out of pocket until we get his worker's comp information.

## 2023-09-21 ENCOUNTER — Other Ambulatory Visit: Payer: Self-pay

## 2023-09-21 DIAGNOSIS — M4802 Spinal stenosis, cervical region: Secondary | ICD-10-CM

## 2023-09-26 ENCOUNTER — Encounter: Payer: Self-pay | Admitting: Neurosurgery

## 2023-09-26 ENCOUNTER — Ambulatory Visit (INDEPENDENT_AMBULATORY_CARE_PROVIDER_SITE_OTHER): Payer: Self-pay | Admitting: Neurosurgery

## 2023-09-26 ENCOUNTER — Ambulatory Visit
Admission: RE | Admit: 2023-09-26 | Discharge: 2023-09-26 | Disposition: A | Discharge status: Home / Self Care | Payer: Worker's Compensation | Source: Ambulatory Visit | Attending: Neurosurgery | Admitting: Neurosurgery

## 2023-09-26 ENCOUNTER — Ambulatory Visit
Admission: RE | Admit: 2023-09-26 | Discharge: 2023-09-26 | Disposition: A | Discharge status: Home / Self Care | Payer: Worker's Compensation | Attending: Neurosurgery | Admitting: Neurosurgery

## 2023-09-26 VITALS — BP 122/80 | Ht 72.0 in | Wt 165.0 lb

## 2023-09-26 DIAGNOSIS — M4802 Spinal stenosis, cervical region: Secondary | ICD-10-CM

## 2023-09-26 DIAGNOSIS — R29898 Other symptoms and signs involving the musculoskeletal system: Secondary | ICD-10-CM

## 2023-09-26 DIAGNOSIS — M5412 Radiculopathy, cervical region: Secondary | ICD-10-CM

## 2023-09-26 DIAGNOSIS — M5410 Radiculopathy, site unspecified: Secondary | ICD-10-CM

## 2023-09-26 DIAGNOSIS — Z09 Encounter for follow-up examination after completed treatment for conditions other than malignant neoplasm: Secondary | ICD-10-CM

## 2023-09-26 NOTE — Progress Notes (Signed)
   REFERRING PHYSICIAN:  No referring provider defined for this encounter.  DOS: 08/14/2023  C5-T1 ACDF  HISTORY OF PRESENT ILLNESS: Neil Parks is 6 weeks status post C5-T1 ACDF on 08/14/2023.  Overall he is having improved left upper extremity weakness.  Continues to have neck stiffness as expected he is only 6 weeks out postoperatively.  PHYSICAL EXAMINATION:  NEUROLOGICAL:  General: In no acute distress.   Awake, alert, oriented to person, place, and time.  Pupils equal round and reactive to light.  Facial tone is symmetric.    Strength:  Side Biceps Triceps Deltoid Interossei Grip Wrist Ext. Wrist Flex.  R 5 5 5 5 5 5 5   L 5 5 5 4 4 4 4     Incision c/d/I  Imaging:  CT Neck 08/22/2023 IMPRESSION: 1. Peripheral enhancing collection within the right aspect of the retropharynx at the level of C5-6 measuring up to 3.0 cm concerning for abscess. Associated soft tissue swelling extending laterally and exerting mass effect on the right carotid space. 2. Additional fluid noted extending along the posterior medial margin of the right sternocleidomastoid muscle extending into the overlying subcutaneous tissues which may reflect additional abscess. 3. Retropharyngeal effusion extending superiorly to the level of C2. 4. Edematous appearance of the pharyngeal mucosa, likely reactive changes. 5. Likely reactive edema of the aryepiglottic folds with narrowing of the supraglottic airway. 6. The findings of this study were discussed with Dr. Ernest at 7:30PM on 08/22/2023. Additional clinical history obtained. Given soft tissue swelling without other clinical signs of infection, the findings may represent post operative seroma. The fluid along the sternocleidomastoid extending anteriorly in the right neck does not demonstrate convincing peripheral enhancement. The collection with the right retropharynx may demonstrate mild peripheral enhancement although evaluation in this region is  limited by streak artifact from hardware. Both areas of fluid may reflect postoperative seroma.  Assessment / Plan: Neil Parks is 6 weeks status post C5-T1 ACDF on 08/14/2023.  He did have some swallowing difficulties postoperatively however has gotten better.  He has had a significant improvement in his left upper extremity weakness.   Patient will continue to recover from surgery.  Will likely have a extended recovery.  Penne MICAEL Sharps, MD Dept of Neurosurgery

## 2023-10-02 ENCOUNTER — Telehealth: Payer: Self-pay

## 2023-10-02 ENCOUNTER — Telehealth: Payer: Self-pay | Admitting: Neurosurgery

## 2023-10-02 ENCOUNTER — Other Ambulatory Visit: Payer: Self-pay | Admitting: Physician Assistant

## 2023-10-02 MED ORDER — OXYCODONE HCL 5 MG PO TABS: 5.0000 mg | ORAL_TABLET | Freq: Four times a day (QID) | ORAL | 0 refills | Status: DC | PRN

## 2023-10-02 MED ORDER — METHOCARBAMOL 500 MG PO TABS: 500.0000 mg | ORAL_TABLET | Freq: Three times a day (TID) | ORAL | 1 refills | Status: DC | PRN

## 2023-10-02 NOTE — Telephone Encounter (Signed)
 Patient is calling to request a refill of Oxycodone  and Robaxin  be sent to [facility] on [address].

## 2023-10-02 NOTE — Telephone Encounter (Signed)
 I logged on to Cover My Meds and this is a workers comp case. The adjuster will obtain authorization for his medications. We are unable to authorize this.   This is how it was last time so patient is aware of the process.

## 2023-10-03 NOTE — Telephone Encounter (Signed)
 Patient called back and is aware the medication has been sent to his pharmacy for him. He states that he will pay for the medication out of pocket and he will be reimbursed by worker's comp.

## 2023-10-05 ENCOUNTER — Ambulatory Visit (INDEPENDENT_AMBULATORY_CARE_PROVIDER_SITE_OTHER): Payer: Self-pay | Admitting: Neurosurgery

## 2023-10-05 DIAGNOSIS — M4802 Spinal stenosis, cervical region: Secondary | ICD-10-CM

## 2023-10-23 ENCOUNTER — Other Ambulatory Visit: Payer: Self-pay | Admitting: Physician Assistant

## 2023-10-23 ENCOUNTER — Telehealth: Payer: Self-pay | Admitting: Physician Assistant

## 2023-10-23 MED ORDER — OXYCODONE HCL 5 MG PO TABS: 5.0000 mg | ORAL_TABLET | Freq: Four times a day (QID) | ORAL | 0 refills | Status: DC | PRN

## 2023-10-23 NOTE — Telephone Encounter (Signed)
 Pt is needing to get the oxycodone  5 mg sent to the [id number] [place] [address] [place] - [address]. They do not want to use the workers comp pharmacy because they are out currently.

## 2023-10-23 NOTE — Telephone Encounter (Signed)
Patient notified that medication has been sent 

## 2023-11-01 ENCOUNTER — Other Ambulatory Visit: Payer: Self-pay

## 2023-11-01 DIAGNOSIS — M4802 Spinal stenosis, cervical region: Secondary | ICD-10-CM

## 2023-11-05 ENCOUNTER — Ambulatory Visit: Payer: Self-pay

## 2023-11-05 ENCOUNTER — Encounter: Payer: Self-pay | Admitting: Physician Assistant

## 2023-11-05 ENCOUNTER — Ambulatory Visit: Payer: Self-pay | Admitting: Physician Assistant

## 2023-11-05 VITALS — BP 136/80 | Temp 98.2°F | Wt 195.0 lb

## 2023-11-05 DIAGNOSIS — M5412 Radiculopathy, cervical region: Secondary | ICD-10-CM

## 2023-11-05 DIAGNOSIS — M4802 Spinal stenosis, cervical region: Secondary | ICD-10-CM

## 2023-11-05 DIAGNOSIS — Z981 Arthrodesis status: Secondary | ICD-10-CM

## 2023-11-05 MED ORDER — OXYCODONE HCL 5 MG PO TABS: 5.0000 mg | ORAL_TABLET | Freq: Four times a day (QID) | ORAL | 0 refills | Status: DC | PRN

## 2023-11-05 NOTE — Progress Notes (Signed)
   REFERRING PHYSICIAN:  No referring provider defined for this encounter.  DOS: 08/14/2023  C5-T1 ACDF  HISTORY OF PRESENT ILLNESS: Neil Parks is 12 weeks status post C5-T1 ACDF on 08/14/2023.  Overall he is having improved left upper extremity weakness.  Continues to have neck stiffness as expected.  He was though at times he has some pressure in his neck that increases when he sits and goes into his shoulder blades.  No new weakness, numbness or tingling.     PHYSICAL EXAMINATION:  NEUROLOGICAL:  General: In no acute distress.   Awake, alert, oriented to person, place, and time.  Pupils equal round and reactive to light.  Facial tone is symmetric.    Strength:  Side Biceps Triceps Deltoid Interossei Grip Wrist Ext. Wrist Flex.  R 5 5 5 5 5 5 5   L 5 5 5 4 4 4 4     Incision c/d/I  Imaging:  CT Neck 08/22/2023 IMPRESSION: 1. Peripheral enhancing collection within the right aspect of the retropharynx at the level of C5-6 measuring up to 3.0 cm concerning for abscess. Associated soft tissue swelling extending laterally and exerting mass effect on the right carotid space. 2. Additional fluid noted extending along the posterior medial margin of the right sternocleidomastoid muscle extending into the overlying subcutaneous tissues which may reflect additional abscess. 3. Retropharyngeal effusion extending superiorly to the level of C2. 4. Edematous appearance of the pharyngeal mucosa, likely reactive changes. 5. Likely reactive edema of the aryepiglottic folds with narrowing of the supraglottic airway. 6. The findings of this study were discussed with Dr. Ernest at 7:30PM on 08/22/2023. Additional clinical history obtained. Given soft tissue swelling without other clinical signs of infection, the findings may represent post operative seroma. The fluid along the sternocleidomastoid extending anteriorly in the right neck does not demonstrate convincing peripheral enhancement.  The collection with the right retropharynx may demonstrate mild peripheral enhancement although evaluation in this region is limited by streak artifact from hardware. Both areas of fluid may reflect postoperative seroma.  Assessment / Plan: Neil Parks is 12 weeks status post C5-T1 ACDF on 08/14/2023.  He has had significant improvement in his left upper extremity weakness.  He still has some expected neck stiffness that extends into his shoulder blades.  Discussed with the patient course of action moving forward.  Recommended patient be seen by physical therapy.  Referral was placed for this.  In addition, spoke with the patient regarding continuing narcotic pain medicine and that this would need to be continued through pain management.  A referral for this was also placed.  He was encouraged to reach out to me for any questions or concerns that he has.   Lyle Decamp, PA-C Dept of Neurosurgery

## 2023-11-15 ENCOUNTER — Other Ambulatory Visit: Payer: Self-pay | Admitting: Physician Assistant

## 2023-11-15 ENCOUNTER — Telehealth: Payer: Self-pay | Admitting: Physician Assistant

## 2023-11-15 MED ORDER — METHOCARBAMOL 500 MG PO TABS: 500.0000 mg | ORAL_TABLET | Freq: Three times a day (TID) | ORAL | 1 refills | Status: AC | PRN

## 2023-11-15 MED ORDER — OXYCODONE HCL 5 MG PO TABS: 5.0000 mg | ORAL_TABLET | Freq: Three times a day (TID) | ORAL | 0 refills | Status: DC | PRN

## 2023-11-15 NOTE — Progress Notes (Signed)
 Refill provided. Notified of needing someone to take over pain meds.

## 2023-11-15 NOTE — Telephone Encounter (Signed)
 Date: 11/15/23 Provider FRISBIE Med Requesting Oxycodone  5mg  & methocarbamol  500mg  Pharmacy  [facility] - [place], [place] - [address]   Patient contact [phone]

## 2023-11-15 NOTE — Telephone Encounter (Signed)
 Pt also wanted to let you know he's working with pain management to get an appt, and he is aware of the pain meds.

## 2023-12-11 ENCOUNTER — Telehealth: Payer: Self-pay | Admitting: Physician Assistant

## 2023-12-11 NOTE — Telephone Encounter (Signed)
 Patient is calling to request a refill of Oxycodone . He states that he is not able to get into pain management until January as his worker's comp has not been approved.   [facility] on [address]

## 2023-12-12 ENCOUNTER — Other Ambulatory Visit: Payer: Self-pay | Admitting: Physician Assistant

## 2023-12-12 MED ORDER — OXYCODONE HCL 5 MG PO TABS: 5.0000 mg | ORAL_TABLET | Freq: Two times a day (BID) | ORAL | 0 refills | Status: AC | PRN

## 2023-12-12 NOTE — Telephone Encounter (Signed)
 Patient called to follow up and I let him know of providers message below. He had no further questions  Ulis Bottcher, PA-C to [facility], [place]  [facility]     12/12/23  9:44 AM Refill sent.  Patient needs to get further refills from primary care provider.  Only short course of meds sent in.

## 2023-12-12 NOTE — Telephone Encounter (Signed)
 I left a message for the patient to call back

## 2024-02-27 ENCOUNTER — Telehealth: Payer: Self-pay

## 2024-02-27 NOTE — Telephone Encounter (Signed)
 Hello,   Our office has tried multiple times to schedule the referral that was sent. However, patient has not heard back from [employer].  and declines to schedule at this time.  We are going to the referral. Please let us  know if the patient needs our services in the future.   Best,  Doyal Fridge
